# Patient Record
Sex: Female | Born: 1995 | Race: Black or African American | Hispanic: No | Marital: Single | Smoking: Never smoker
Health system: Southern US, Community
[De-identification: ages and names within clinical notes are randomized; demographics above are authoritative.]

## PROBLEM LIST (undated history)

## (undated) DIAGNOSIS — Z789 Other specified health status: Secondary | ICD-10-CM

## (undated) HISTORY — PX: NO PAST SURGERIES: SHX2092

---

## 2020-03-27 LAB — OB RESULTS CONSOLE GBS: GBS: POSITIVE

## 2020-06-15 NOTE — L&D Delivery Note (Signed)
OB/GYN Faculty Practice Delivery Note  Laura Roy is a 25 y.o. G3P1001 s/p SVD at [redacted]w[redacted]d. She was admitted for IOL for Post-dates.   ROM: 3h 65m with clear fluid GBS Status: Positive, adequately treated Maximum Maternal Temperature: 99.1  Labor Progress: . Pit 0200  . AROM @1800   Delivery Date/Time: 10/18/20 at 2111 Delivery: Called to room and patient was complete and pushing. Head delivered OA. No nuchal cord present. Shoulder and body delivered in usual fashion. Infant with spontaneous cry, placed on mother's abdomen, dried and stimulated. Cord clamped x 2 after 1-minute delay, and cut by fob under my direct supervision. Cord blood drawn. Placenta delivered spontaneously with gentle cord traction. Fundus firm with massage and Pitocin. Labia, perineum, vagina, and cervix were inspected, no tears observed.   Placenta: Intact, 3 vessel cord, sent to L&D Complications: Terminal Meconium Lacerations: None EBL: 25 mL Analgesia: Epidural  Infant: Healthy Baby Girl  APGARs 8,9   2112, MD Family Medicine PGY-1, Faculty Practice

## 2020-09-05 ENCOUNTER — Encounter: Payer: Self-pay | Admitting: Obstetrics and Gynecology

## 2020-09-05 ENCOUNTER — Ambulatory Visit (INDEPENDENT_AMBULATORY_CARE_PROVIDER_SITE_OTHER): Payer: BC Managed Care – PPO | Admitting: Obstetrics and Gynecology

## 2020-09-05 ENCOUNTER — Other Ambulatory Visit: Payer: Self-pay

## 2020-09-05 DIAGNOSIS — Z3A39 39 weeks gestation of pregnancy: Secondary | ICD-10-CM | POA: Diagnosis not present

## 2020-09-05 DIAGNOSIS — R8271 Bacteriuria: Secondary | ICD-10-CM | POA: Insufficient documentation

## 2020-09-05 DIAGNOSIS — Z3480 Encounter for supervision of other normal pregnancy, unspecified trimester: Secondary | ICD-10-CM | POA: Diagnosis not present

## 2020-09-05 DIAGNOSIS — Z3A34 34 weeks gestation of pregnancy: Secondary | ICD-10-CM

## 2020-09-05 DIAGNOSIS — Z3490 Encounter for supervision of normal pregnancy, unspecified, unspecified trimester: Secondary | ICD-10-CM | POA: Insufficient documentation

## 2020-09-05 DIAGNOSIS — O99013 Anemia complicating pregnancy, third trimester: Secondary | ICD-10-CM | POA: Diagnosis not present

## 2020-09-05 NOTE — Progress Notes (Signed)
   PRENATAL VISIT NOTE  Subjective:  Laura Roy is a 25 y.o. G3P1001 at [redacted]w[redacted]d being seen today for ongoing prenatal care.  She is currently monitored for the following issues for this low-risk pregnancy and has Supervision of normal pregnancy, antepartum on their problem list.  Pt is a late transfer of care from [name] , [name].  Records reviewed.  Still accumulating some lab info.  Per pt no issues this pregnancy with HTN or blood sugar control.  Patient doing well with no acute concerns today. She reports mild left hip pain.  Contractions: Irritability. Vag. Bleeding: None.  Movement: Present. Denies leaking of fluid.   The following portions of the patient's history were reviewed and updated as appropriate: allergies, current medications, past family history, past medical history, past social history, past surgical history and problem list. Problem list updated.  Objective:   Vitals:   09/05/20 0826  BP: 130/84  Pulse: (!) 112  Weight: 216 lb (98 kg)    Fetal Status: Fetal Heart Rate (bpm): 144 Fundal Height: 34 cm Movement: Present     General:  Alert, oriented and cooperative. Patient is in no acute distress.  Skin: Skin is warm and dry. No rash noted.   Cardiovascular: Normal heart rate noted  Respiratory: Normal respiratory effort, no problems with respiration noted  Abdomen: Soft, gravid, appropriate for gestational age.  Pain/Pressure: Present     Pelvic: Cervical exam deferred        Extremities: Normal range of motion.  Edema: None  Mental Status:  Normal mood and affect. Normal behavior. Normal judgment and thought content.   Assessment and Plan:  Pregnancy: G3P1001 at [redacted]w[redacted]d  1. Encounter for supervision of normal pregnancy, antepartum, unspecified gravidity G3P1, follow up in 2 week for GC/C only, check cervix.  Preterm labor symptoms and general obstetric precautions including but not limited to vaginal bleeding, contractions, leaking of fluid and fetal movement  were reviewed in detail with the patient.  Please refer to After Visit Summary for other counseling recommendations.   Return in about 2 weeks (around 09/19/2020) for ROB, in person, 36 weeks swabs.   Mariel Aloe, MD Faculty Attending [facility]

## 2020-09-05 NOTE — Progress Notes (Signed)
Per notes Transfer NOB [redacted]w[redacted]d [employer] not attached in [employer] seen in care everywhere.from 03/2020 LMP:12/19/19 Last pap:04/09/19 : LGSIL Urine Cx +GBS from last office . Per notes will treat in labor  *pt had U/S on 04/02/20 EDD 10/12/20.  CC: Hip pain causing discomfort when walking.

## 2020-09-05 NOTE — Patient Instructions (Signed)

## 2020-09-13 ENCOUNTER — Other Ambulatory Visit: Payer: Self-pay

## 2020-09-13 ENCOUNTER — Encounter (HOSPITAL_COMMUNITY): Payer: Self-pay | Admitting: Emergency Medicine

## 2020-09-13 ENCOUNTER — Emergency Department (HOSPITAL_COMMUNITY)
Admission: EM | Admit: 2020-09-13 | Discharge: 2020-09-13 | Disposition: A | Discharge status: Home / Self Care | Payer: BC Managed Care – PPO | Attending: Emergency Medicine | Admitting: Emergency Medicine

## 2020-09-13 DIAGNOSIS — Z3A36 36 weeks gestation of pregnancy: Secondary | ICD-10-CM | POA: Diagnosis not present

## 2020-09-13 DIAGNOSIS — M545 Low back pain, unspecified: Secondary | ICD-10-CM | POA: Insufficient documentation

## 2020-09-13 DIAGNOSIS — O26893 Other specified pregnancy related conditions, third trimester: Secondary | ICD-10-CM | POA: Diagnosis not present

## 2020-09-13 DIAGNOSIS — Z3490 Encounter for supervision of normal pregnancy, unspecified, unspecified trimester: Secondary | ICD-10-CM

## 2020-09-13 LAB — URINALYSIS, ROUTINE W REFLEX MICROSCOPIC
Bilirubin Urine: NEGATIVE
Glucose, UA: NEGATIVE mg/dL
Hgb urine dipstick: NEGATIVE
Ketones, ur: NEGATIVE mg/dL
Leukocytes,Ua: NEGATIVE
Nitrite: NEGATIVE
Protein, ur: NEGATIVE mg/dL
Specific Gravity, Urine: 1.001 — ABNORMAL LOW (ref 1.005–1.030)
pH: 8 (ref 5.0–8.0)

## 2020-09-13 MED ORDER — LACTATED RINGERS IV BOLUS
1000.0000 mL | Freq: Once | INTRAVENOUS | Status: AC
  Administered 2020-09-13: 1000 mL via INTRAVENOUS

## 2020-09-13 NOTE — ED Triage Notes (Signed)
Patient complains of 'period-like cramping' since this morning, but endorses back pain since Tuesday. Patient is 35 and 5, G2P1, denies bleeding or spotting.

## 2020-09-13 NOTE — Discharge Instructions (Addendum)
Go to [facility] hospital for pregnancy/labor.  You can safely take tylenol for low back pain.

## 2020-09-13 NOTE — ED Provider Notes (Addendum)
[facility]-EMERGENCY DEPT Provider Note   CSN: [id number] Arrival date & time: 09/13/20  1142     History Chief Complaint  Patient presents with  . Back Pain  . Abdominal Cramping    Laura Roy is a 25 y.o. female.  Pt complains of lower abdominal pain and cramping.  Pt is 35.[redacted] weeks pregnant. G2P1 1001.  Pt reports some low back pain.  Pt reports pain feels like menstrual cramps.  Pt denies feeling like she is in labor.  Pt denies any other complaints Pt has had one prenatal visit at [facility].   The history is provided by the patient. No language interpreter was used.  Abdominal Cramping       History reviewed. No pertinent past medical history.  Patient Active Problem List   Diagnosis Date Noted  . Supervision of normal pregnancy, antepartum 09/05/2020  . [redacted] weeks gestation of pregnancy 09/05/2020  . GBS bacteriuria 09/05/2020    History reviewed. No pertinent surgical history.   OB History    Gravida  3   Para  1   Term  1   Preterm      AB      Living  1     SAB      IAB      Ectopic      Multiple      Live Births  1           Family History  Problem Relation Age of Onset  . Kidney Stones Mother     Social History   Tobacco Use  . Smoking status: Never Smoker  . Smokeless tobacco: Never Used  Vaping Use  . Vaping Use: Never used  Substance Use Topics  . Alcohol use: Not Currently  . Drug use: Never    Home Medications Prior to Admission medications   Medication Sig Start Date End Date Taking? Authorizing Provider  acetaminophen (TYLENOL) 325 MG tablet Take 650 mg by mouth every 6 (six) hours as needed.    [provider]    Allergies    Patient has no known allergies.  Review of Systems   Review of Systems  All other systems reviewed and are negative.   Physical Exam Updated Vital Signs BP (!) 138/91 (BP Location: Left Arm)   Pulse 99   Temp 97.8 F (36.6 C) (Oral)   Resp (!) 22    Ht 5\' 5"  (1.651 m)   Wt 98 kg   LMP 12/19/2019   SpO2 99%   BMI 35.94 kg/m   Physical Exam Vitals and nursing note reviewed.  Constitutional:      Appearance: She is well-developed.  HENT:     Head: Normocephalic.  Cardiovascular:     Rate and Rhythm: Normal rate and regular rhythm.  Pulmonary:     Effort: Pulmonary effort is normal.  Abdominal:     General: Abdomen is flat. There is no distension.  Musculoskeletal:        General: Normal range of motion.     Cervical back: Normal range of motion.  Skin:    General: Skin is warm.  Neurological:     General: No focal deficit present.     Mental Status: She is alert and oriented to person, place, and time.  Psychiatric:        Mood and Affect: Mood normal.     ED Results / Procedures / Treatments   Labs (all labs ordered are listed, but only  abnormal results are displayed) Labs Reviewed - No data to display  EKG None  Radiology No results found.  Procedures Procedures   Medications Ordered in ED Medications - No data to display  ED Course  I have reviewed the triage vital signs and the nursing notes.  Pertinent labs & imaging results that were available during my care of the patient were reviewed by me and considered in my medical decision making (see chart for details).    MDM Rules/Calculators/A&P                          MDM:  Rapid response OB nurse at bedside.  Pt cervix is less tahn 1, no current labor.   Pt advised she cn take tylenol  [facility]gan[facility] issues  Final Clinical Impression(s) / ED Diagnoses Final diagnoses:  Encounter for supervision of normal pregnancy, antepartum, unspecified gravidity    Rx / DC Orders ED Discharge Orders    None       Osie Cheeks 09/13/20 1237    Elson Areas, PA-C 09/13/20 1354    Benjiman Core, MD 09/13/20 1430

## 2020-09-13 NOTE — Progress Notes (Signed)
G2P1 at 35 6/7 weeks reports to [facility] with c/o period cramping since 9am and constant back pain since Tuesday.  No bleeding or leaking noted.  SVE 1/thick/ballotable and unable to determine presentation.  In no distress.   Just started [facility] with [facility].  Moved to [place] in middle of January 2022.  Monitors on for EFM.

## 2020-09-13 NOTE — Progress Notes (Signed)
Talked with [facility] provider.  Cleared by OB Service.  Pt to follow up with [facility] on scheduled appt this Thursday.  Call office with any questions or concerns.  Instructed to come to MAU at [facility] with any OB complaints.

## 2020-09-18 ENCOUNTER — Ambulatory Visit (INDEPENDENT_AMBULATORY_CARE_PROVIDER_SITE_OTHER): Payer: BC Managed Care – PPO | Admitting: Women's Health

## 2020-09-18 ENCOUNTER — Other Ambulatory Visit: Payer: Self-pay

## 2020-09-18 ENCOUNTER — Other Ambulatory Visit (HOSPITAL_COMMUNITY)
Admission: RE | Admit: 2020-09-18 | Discharge: 2020-09-18 | Disposition: A | Discharge status: Home / Self Care | Payer: BC Managed Care – PPO | Source: Ambulatory Visit | Attending: Obstetrics and Gynecology | Admitting: Obstetrics and Gynecology

## 2020-09-18 VITALS — BP 133/81 | HR 114 | Wt 215.0 lb

## 2020-09-18 DIAGNOSIS — R8271 Bacteriuria: Secondary | ICD-10-CM

## 2020-09-18 DIAGNOSIS — Z3480 Encounter for supervision of other normal pregnancy, unspecified trimester: Secondary | ICD-10-CM | POA: Insufficient documentation

## 2020-09-18 DIAGNOSIS — Z3A36 36 weeks gestation of pregnancy: Secondary | ICD-10-CM | POA: Insufficient documentation

## 2020-09-18 DIAGNOSIS — O283 Abnormal ultrasonic finding on antenatal screening of mother: Secondary | ICD-10-CM | POA: Insufficient documentation

## 2020-09-18 DIAGNOSIS — Z3483 Encounter for supervision of other normal pregnancy, third trimester: Secondary | ICD-10-CM | POA: Insufficient documentation

## 2020-09-18 DIAGNOSIS — R87612 Low grade squamous intraepithelial lesion on cytologic smear of cervix (LGSIL): Secondary | ICD-10-CM | POA: Insufficient documentation

## 2020-09-18 LAB — OB RESULTS CONSOLE GC/CHLAMYDIA: Gonorrhea: NEGATIVE

## 2020-09-18 NOTE — Progress Notes (Signed)
Subjective:  Laura Roy is a 25 y.o. G3P1001 at [redacted]w[redacted]d being seen today for ongoing prenatal care.  She is currently monitored for the following issues for this low-risk pregnancy and has Supervision of normal pregnancy, antepartum; GBS bacteriuria; LGSIL on Pap smear of cervix; and Abnormal fetal ultrasound on their problem list.  Patient reports no complaints.  Contractions: Irregular. Vag. Bleeding: None.  Movement: Present. Denies leaking of fluid.   The following portions of the patient's history were reviewed and updated as appropriate: allergies, current medications, past family history, past medical history, past social history, past surgical history and problem list. Problem list updated.  Objective:   Vitals:   09/18/20 1100  BP: 133/81  Pulse: (!) 114  Weight: 215 lb (97.5 kg)    Fetal Status: Fetal Heart Rate (bpm): 150 Fundal Height: 36 cm Movement: Present     General:  Alert, oriented and cooperative. Patient is in no acute distress.  Skin: Skin is warm and dry. No rash noted.   Cardiovascular: Normal heart rate noted  Respiratory: Normal respiratory effort, no problems with respiration noted  Abdomen: Soft, gravid, appropriate for gestational age. Pain/Pressure: Present     Pelvic: Vag. Bleeding: None     Cervical exam performed Dilation: 1 Effacement (%): 0 Station: Ballotable  Extremities: Normal range of motion.  Edema: None  Mental Status: Normal mood and affect. Normal behavior. Normal judgment and thought content.   Urinalysis:      Assessment and Plan:  Pregnancy: G3P1001 at [redacted]w[redacted]d  1. Supervision of other normal pregnancy, antepartum - peds list given - Cervicovaginal ancillary only( [place]) - Hepatitis C Antibody - HIV antibody (with reflex) - RPR - CBC - above labs not available from prenatal records - borderline BP, baseline pre-e labs drawn today  2. GBS bacteriuria -treat in labor  3. [redacted] weeks gestation of pregnancy  5. Abnormal  fetal ultrasound - bilateral fetal pyelectasis, repeat US ordered - US MFM OB DETAIL +14 WK; Future   Term labor symptoms and general obstetric precautions including but not limited to vaginal bleeding, contractions, leaking of fluid and fetal movement were reviewed in detail with the patient. I discussed the assessment and treatment plan with the patient. The patient was provided an opportunity to ask questions and all were answered. The patient agreed with the plan and demonstrated an understanding of the instructions. The patient was advised to call back or seek an in-person office evaluation/go to MAU at [facility] for any urgent or concerning symptoms. Please refer to After Visit Summary for other counseling recommendations.  Return in about 1 week (around 09/25/2020) for in-person LOB/APP OK, pt needs [place] with MFM.   Emiel Kielty, Odie Sera, NP

## 2020-09-18 NOTE — Patient Instructions (Addendum)
Maternity Assessment Unit (MAU)  The Maternity Assessment Unit (MAU) is located at the [facility] and [place] at [facility]. The address is: [address], [place], [place], [place] [address]. Please see map below for additional directions.    The Maternity Assessment Unit is designed to help you during your pregnancy, and for up to 6 weeks after delivery, with any pregnancy- or postpartum-related emergencies, if you think you are in labor, or if your water has broken. For example, if you experience nausea and vomiting, vaginal bleeding, severe abdominal or pelvic pain, elevated blood pressure or other problems related to your pregnancy or postpartum time, please come to the Maternity Assessment Unit for assistance.        Signs and Symptoms of Labor Labor is the body's natural process of moving the baby and the placenta out of the uterus. The process of labor usually starts when the baby is full-term, between 23 and 40 weeks of pregnancy. Signs and symptoms that you are close to going into labor As your body prepares for labor and the birth of your baby, you may notice the following symptoms in the weeks and days before true labor starts:  Passing a small amount of thick, bloody mucus from your vagina. This is called normal bloody show or losing your mucus plug. This may happen more than a week before labor begins, or right before labor begins, as the opening of the cervix starts to widen (dilate). For some women, the entire mucus plug passes at once. For others, pieces of the mucus plug may gradually pass over several days.  Your baby moving (dropping) lower in your pelvis to get into position for birth (lightening). When this happens, you may feel more pressure on your bladder and pelvic bone and less pressure on your ribs. This may make it easier to breathe. It may also cause you to need to urinate more often and have problems with bowel movements.  Having "practice  contractions," also called Braxton Hicks contractions or false labor. These occur at irregular (unevenly spaced) intervals that are more than 10 minutes apart. False labor contractions are common after exercise or sexual activity. They will stop if you change position, rest, or drink fluids. These contractions are usually mild and do not get stronger over time. They may feel like: ? A backache or back pain. ? Mild cramps, similar to menstrual cramps. ? Tightening or pressure in your abdomen. Other early symptoms include:  Nausea or loss of appetite.  Diarrhea.  Having a sudden burst of energy, or feeling very tired.  Mood changes.  Having trouble sleeping.   Signs and symptoms that labor has begun Signs that you are in labor may include:  Having contractions that come at regular (evenly spaced) intervals and increase in intensity. This may feel like more intense tightening or pressure in your abdomen that moves to your back. ? Contractions may also feel like rhythmic pain in your upper thighs or back that comes and goes at regular intervals. ? For first-time mothers, this change in intensity of contractions often occurs at a more gradual pace. ? Women who have given birth before may notice a more rapid progression of contraction changes.  Feeling pressure in the vaginal area.  Your water breaking (rupture of membranes). This is when the sac of fluid that surrounds your baby breaks. Fluid leaking from your vagina may be clear or blood-tinged. Labor usually starts within 24 hours of your water breaking, but it  may take longer to begin. ? Some women may feel a sudden gush of fluid. ? Others notice that their underwear repeatedly becomes damp. Follow these instructions at home:  When labor starts, or if your water breaks, call your health care provider or nurse care line. Based on your situation, they will determine when you should go in for an exam.  During early labor, you may be able  to rest and manage symptoms at home. Some strategies to try at home include: ? Breathing and relaxation techniques. ? Taking a warm bath or shower. ? Listening to music. ? Using a heating pad on the lower back for pain. If you are directed to use heat:  Place a towel between your skin and the heat source.  Leave the heat on for 20-30 minutes.  Remove the heat if your skin turns bright red. This is especially important if you are unable to feel pain, heat, or cold. You may have a greater risk of getting burned.   Contact a health care provider if:  Your labor has started.  Your water breaks. Get help right away if:  You have painful, regular contractions that are 5 minutes apart or less.  Labor starts before you are [redacted] weeks along in your pregnancy.  You have a fever.  You have bright red blood coming from your vagina.  You do not feel your baby moving.  You have a severe headache with or without vision problems.  You have severe nausea, vomiting, or diarrhea.  You have chest pain or shortness of breath. These symptoms may represent a serious problem that is an emergency. Do not wait to see if the symptoms will go away. Get medical help right away. Call your local emergency services (911 in the [place] Do not drive yourself to the hospital. Summary  Labor is your body's natural process of moving your baby and the placenta out of your uterus.  The process of labor usually starts when your baby is full-term, between 72 and 40 weeks of pregnancy.  When labor starts, or if your water breaks, call your health care provider or nurse care line. Based on your situation, they will determine when you should go in for an exam. This information is not intended to replace advice given to you by your health care provider. Make sure you discuss any questions you have with your health care provider. Document Revised: 03/23/2020 Document Reviewed: 03/23/2020 Elsevier Patient Education  2021  Elsevier Inc.       AREA PEDIATRIC/FAMILY PRACTICE PHYSICIANS  [facility] [address] [place], [place] [address] Phone - [phone]   Fax - [phone]  [facility] [address] [place], [place]  [address] Phone - [phone]   Fax - [phone]  [facility] [address] [place], [place]  [address] Phone - [phone]   Fax - [phone]  [facility] [address] [place], [place]  [address] Phone - [phone]   Fax - [phone]  [facility] [address], [address] [place], [place]  [address] Phone - [phone]   Fax - [phone]  [facility] [address] [place], [place]  [address] Phone - [phone]   Fax - [phone]  [facility] [place] [address] [place], [place]  [address] Phone - [phone]   Fax - [phone]  [facility] AT [facility] [address] [place], [address] [place],  [place]  [address] Phone - [phone]   Fax - [phone]  [facility] AT [facility] [address] [place], [place]  [address] Phone - [phone]   Fax - [phone] [facility] AT [place] [address] [place], [place]  [address] Phone - [phone]   Fax - [phone]  [facility] [address] [place], [place]  [address] Phone - [phone]   Fax - [phone]  [facility] [address], [address] [place], [place]  [address] Phone - [phone]   Fax - [phone]  [facility] [place] [address] [place], [place]  [address] Phone - [phone]   Fax - [phone]  [facility] [address] [place], [place]  [address] Phone - [phone]  [facility] [address] [place], [place]  [address] Phone - [phone]   Fax - [phone]  [facility] [address] [place], [place]  [address] Phone - [phone]   Fax - [phone]  [facility] [address] [place], [place]  [address] Phone - [phone]   Fax - [phone]  [facility] [address] [place], [place]  [address] Phone - [phone]   Fax - [phone]   [facility] [address] [place], [place]  [address] Phone - [phone]   Fax - [phone]  [facility] [address] [place], [place]  [address] Phone - [phone]   Fax - [phone]  DAVID RUBIN [address] [place], [place]  [address] Phone - [phone]   Fax - [phone]  [facility] [address] [place], [place]  [address] Phone - [phone]   Fax - [phone]  [place] [facility] Alita Chyle [address] [place], [place]  [address] Phone - [phone]   Fax - [phone] Gerarda Fraction [address] [place], [place]  [address] Phone - [phone]   Fax - [phone]  [facility] [address] [place], [place]  [address] Phone - [phone]   Fax - [phone]  [facility] [place] [address] [place], [place]  [address] Phone - [phone]   Fax - [phone]

## 2020-09-18 NOTE — Progress Notes (Signed)
+   Fetal movement. Pt is having irregular contractions about every 10-15 minutes about since last night.

## 2020-09-19 ENCOUNTER — Encounter: Payer: BC Managed Care – PPO | Admitting: Obstetrics and Gynecology

## 2020-09-19 ENCOUNTER — Ambulatory Visit: Payer: BC Managed Care – PPO | Admitting: *Deleted

## 2020-09-19 ENCOUNTER — Ambulatory Visit: Payer: BC Managed Care – PPO | Attending: Women's Health

## 2020-09-19 ENCOUNTER — Encounter: Payer: Self-pay | Admitting: *Deleted

## 2020-09-19 DIAGNOSIS — Z3480 Encounter for supervision of other normal pregnancy, unspecified trimester: Secondary | ICD-10-CM

## 2020-09-19 DIAGNOSIS — O99213 Obesity complicating pregnancy, third trimester: Secondary | ICD-10-CM | POA: Diagnosis not present

## 2020-09-19 DIAGNOSIS — O283 Abnormal ultrasonic finding on antenatal screening of mother: Secondary | ICD-10-CM | POA: Diagnosis not present

## 2020-09-19 DIAGNOSIS — Z3A36 36 weeks gestation of pregnancy: Secondary | ICD-10-CM | POA: Diagnosis not present

## 2020-09-19 DIAGNOSIS — E669 Obesity, unspecified: Secondary | ICD-10-CM | POA: Diagnosis not present

## 2020-09-19 DIAGNOSIS — O359XX0 Maternal care for (suspected) fetal abnormality and damage, unspecified, not applicable or unspecified: Secondary | ICD-10-CM

## 2020-09-19 LAB — CBC
Hematocrit: 32.1 % — ABNORMAL LOW (ref 34.0–46.6)
Hemoglobin: 9.9 g/dL — ABNORMAL LOW (ref 11.1–15.9)
MCH: 25.1 pg — ABNORMAL LOW (ref 26.6–33.0)
MCHC: 30.8 g/dL — ABNORMAL LOW (ref 31.5–35.7)
MCV: 82 fL (ref 79–97)
Platelets: 321 10*3/uL (ref 150–450)
RBC: 3.94 x10E6/uL (ref 3.77–5.28)
RDW: 14.3 % (ref 11.7–15.4)
WBC: 6.9 10*3/uL (ref 3.4–10.8)

## 2020-09-19 LAB — COMPREHENSIVE METABOLIC PANEL
ALT: 14 IU/L (ref 0–32)
AST: 13 IU/L (ref 0–40)
Albumin/Globulin Ratio: 1.2 (ref 1.2–2.2)
Albumin: 3.6 g/dL — ABNORMAL LOW (ref 3.9–5.0)
Alkaline Phosphatase: 100 IU/L (ref 44–121)
BUN/Creatinine Ratio: 6 — ABNORMAL LOW (ref 9–23)
BUN: 3 mg/dL — ABNORMAL LOW (ref 6–20)
Bilirubin Total: <0.2 mg/dL (ref 0.0–1.2)
CO2: 18 mmol/L — ABNORMAL LOW (ref 20–29)
Calcium: 9 mg/dL (ref 8.7–10.2)
Chloride: 103 mmol/L (ref 96–106)
Creatinine, Ser: 0.53 mg/dL — ABNORMAL LOW (ref 0.57–1.00)
Globulin, Total: 3 g/dL (ref 1.5–4.5)
Glucose: 87 mg/dL (ref 65–99)
Potassium: 4.2 mmol/L (ref 3.5–5.2)
Sodium: 138 mmol/L (ref 134–144)
Total Protein: 6.6 g/dL (ref 6.0–8.5)
eGFR: 130 mL/min/{1.73_m2} (ref >59)

## 2020-09-19 LAB — CERVICOVAGINAL ANCILLARY ONLY
Chlamydia: NEGATIVE
Comment: NEGATIVE
Comment: NEGATIVE
Comment: NORMAL
Neisseria Gonorrhea: NEGATIVE
Trichomonas: NEGATIVE

## 2020-09-19 LAB — HIV ANTIBODY (ROUTINE TESTING W REFLEX): HIV Screen 4th Generation wRfx: NONREACTIVE

## 2020-09-19 LAB — RPR: RPR Ser Ql: NONREACTIVE

## 2020-09-19 LAB — PROTEIN / CREATININE RATIO, URINE
Creatinine, Urine: 63.9 mg/dL
Protein, Ur: 9.9 mg/dL
Protein/Creat Ratio: 155 mg/g creat (ref 0–200)

## 2020-09-19 LAB — HEPATITIS C ANTIBODY: Hep C Virus Ab: <0.1 s/co ratio (ref 0.0–0.9)

## 2020-09-21 ENCOUNTER — Encounter: Payer: Self-pay | Admitting: Women's Health

## 2020-09-21 DIAGNOSIS — O99019 Anemia complicating pregnancy, unspecified trimester: Secondary | ICD-10-CM | POA: Insufficient documentation

## 2020-09-25 ENCOUNTER — Other Ambulatory Visit: Payer: Self-pay

## 2020-09-25 ENCOUNTER — Ambulatory Visit (INDEPENDENT_AMBULATORY_CARE_PROVIDER_SITE_OTHER): Payer: BC Managed Care – PPO | Admitting: Women's Health

## 2020-09-25 ENCOUNTER — Encounter: Payer: Self-pay | Admitting: Women's Health

## 2020-09-25 VITALS — BP 128/77 | HR 103 | Wt 216.0 lb

## 2020-09-25 DIAGNOSIS — R8271 Bacteriuria: Secondary | ICD-10-CM

## 2020-09-25 DIAGNOSIS — R87612 Low grade squamous intraepithelial lesion on cytologic smear of cervix (LGSIL): Secondary | ICD-10-CM

## 2020-09-25 DIAGNOSIS — Z3480 Encounter for supervision of other normal pregnancy, unspecified trimester: Secondary | ICD-10-CM

## 2020-09-25 DIAGNOSIS — Z3A37 37 weeks gestation of pregnancy: Secondary | ICD-10-CM

## 2020-09-25 DIAGNOSIS — O283 Abnormal ultrasonic finding on antenatal screening of mother: Secondary | ICD-10-CM

## 2020-09-25 DIAGNOSIS — O99013 Anemia complicating pregnancy, third trimester: Secondary | ICD-10-CM

## 2020-09-25 MED ORDER — FERROUS SULFATE 324 (65 FE) MG PO TBEC: 1.0000 | DELAYED_RELEASE_TABLET | ORAL | 1 refills | Status: DC

## 2020-09-25 NOTE — Progress Notes (Signed)
Subjective:  Laura Roy is a 25 y.o. G3P1001 at [redacted]w[redacted]d being seen today for ongoing prenatal care.  She is currently monitored for the following issues for this low-risk pregnancy and has Supervision of normal pregnancy, antepartum; GBS bacteriuria; LGSIL on Pap smear of cervix; Abnormal fetal ultrasound; and Anemia in pregnancy on their problem list.  Patient reports no complaints.  Contractions: Irritability. Vag. Bleeding: None.  Movement: Present. Denies leaking of fluid.   The following portions of the patient's history were reviewed and updated as appropriate: allergies, current medications, past family history, past medical history, past social history, past surgical history and problem list. Problem list updated.  Objective:   Vitals:   09/25/20 0856  BP: 128/77  Pulse: (!) 103  Weight: 216 lb (98 kg)    Fetal Status: Fetal Heart Rate (bpm): 138   Movement: Present  Presentation: Undeterminable  General:  Alert, oriented and cooperative. Patient is in no acute distress.  Skin: Skin is warm and dry. No rash noted.   Cardiovascular: Normal heart rate noted  Respiratory: Normal respiratory effort, no problems with respiration noted  Abdomen: Soft, gravid, appropriate for gestational age. Pain/Pressure: Present     Pelvic: Vag. Bleeding: None Vag D/C Character: Thin   Cervical exam performed Dilation: 1 Effacement (%): 0 Station: Ballotable  Extremities: Normal range of motion.  Edema: None  Mental Status: Normal mood and affect. Normal behavior. Normal judgment and thought content.   Urinalysis:      Assessment and Plan:  Pregnancy: G3P1001 at [redacted]w[redacted]d  1. Supervision of other normal pregnancy, antepartum -no concerns  2. GBS bacteriuria -treat in labor  3. Abnormal fetal ultrasound -fetal pyelectasis bilateral on anatomy scan, f/u ordered -->L kidney nl, R kidney mild 0.8cm, no f/u indicated per MFM until delivery  4. Anemia during pregnancy in third trimester -RX  oral iron  5. [redacted] weeks gestation of pregnancy   Term labor symptoms and general obstetric precautions including but not limited to vaginal bleeding, contractions, leaking of fluid and fetal movement were reviewed in detail with the patient. I discussed the assessment and treatment plan with the patient. The patient was provided an opportunity to ask questions and all were answered. The patient agreed with the plan and demonstrated an understanding of the instructions. The patient was advised to call back or seek an in-person office evaluation/go to MAU at [facility] for any urgent or concerning symptoms. Please refer to After Visit Summary for other counseling recommendations.  Return in about 1 week (around 10/02/2020) for in-person LOB/APP OK.   Keno Caraway, Odie Sera, NP

## 2020-09-25 NOTE — Progress Notes (Signed)
ROB [redacted]w[redacted]d  GBS in urine GC/CT done on 09/18/20 : Negative   CC: pt would like cervix check notes pressure. HA's no swelling and cramping.

## 2020-09-25 NOTE — Patient Instructions (Signed)
Maternity Assessment Unit (MAU)  The Maternity Assessment Unit (MAU) is located at the [facility] at [facility]. The address is: [address] [place], [place], [place] [address]. Please see map below for additional directions.    The Maternity Assessment Unit is designed to help you during your pregnancy, and for up to 6 weeks after delivery, with any pregnancy- or postpartum-related emergencies, if you think you are in labor, or if your water has broken. For example, if you experience nausea and vomiting, vaginal bleeding, severe abdominal or pelvic pain, elevated blood pressure or other problems related to your pregnancy or postpartum time, please come to the Maternity Assessment Unit for assistance.        Pregnancy and Anemia  Anemia is a condition in which there is not enough red blood cells or hemoglobin in the blood. Hemoglobin is a substance in red blood cells that carries oxygen. When you do not have enough red blood cells or hemoglobin (are anemic), your body cannot get enough oxygen and your organs may not work properly. Anemia is common during pregnancy because your body needs more blood volume and blood cells to provide nutrition to the unborn baby. What are the causes? The most common cause of anemia during pregnancy is not having enough iron in the body to make red blood cells (iron deficiency anemia). Other causes may include:  Folic acid deficiency.  Vitamin B12 deficiency.  Certain prescription or over-the-counter medicines.  Certain medical conditions or infections that destroy red blood cells.  A low platelet count and bleeding caused by antibodies that go through the placenta to the baby from the mother's blood. What are the signs or symptoms? Mild anemia may not cause any symptoms. If anemia becomes severe, symptoms may include:  Feeling tired or weak.  Shortness of breath, especially during activity.  Fainting.  Pale  skin.  Headaches.  A fast or irregular heartbeat.  Dizziness. How is this diagnosed? This condition may be diagnosed based on your medical history and a physical exam. You may also have blood tests. How is this treated? Treatment for anemia during pregnancy depends on the cause of the anemia. Treatment may include:  Making changes to your diet.  Taking iron, vitamin B12, or folic acid supplements.  Having a blood transfusion. This may be needed if the anemia is severe. Follow these instructions at home: Eating and drinking  Follow recommendations from your health care provider about changing your diet.  Eat a diet rich in iron. This would include foods such as: ? Liver. ? Beef. ? Eggs. ? Whole grains. ? Spinach. ? Dried fruit.  Increase your vitamin C intake. This will help the stomach absorb more iron. Some foods that are high in vitamin C include: ? Oranges. ? Peppers. ? Tomatoes. ? Mangoes.  Eat green leafy vegetables. These are a good source of folic acid. General instructions  Take iron supplements and vitamins as told by your health care provider.  Keep all follow-up visits. This is important. Contact a health care provider if:  You have headaches that happen often or do not go away.  You bruise easily.  You have a fever.  You have nausea and vomiting for more than 24 hours.  You are unable to take supplements prescribed to treat your anemia. Get help right away if:  You develop signs or symptoms of severe anemia.  You have bleeding from your vagina.  You develop a rash.  You have  bloody or tarry stools.  You are very dizzy or you faint. Summary  Anemia is a condition in which there is not enough red blood cells or hemoglobin in the blood.  The most common cause of anemia during pregnancy is not having enough iron in the body to make red blood cells (iron deficiency anemia).  Mild anemia may not cause any symptoms. If it becomes severe,  symptoms may include feeling tired and weak.  Take iron supplements and vitamins as told by your health care provider.  Keep all follow-up visits. This is important. This information is not intended to replace advice given to you by your health care provider. Make sure you discuss any questions you have with your health care provider. Document Revised: 10/03/2019 Document Reviewed: 10/03/2019 Elsevier Patient Education  2021 [employer].       Your bloodwork shows that you have anemia. I have sent you a prescription for ferrous sulfate 325mg . Take 1 tablet, every other day. Take iron without food, if possible. If it must be taken with food due to upset stomach, take with orange juice. Avoid taking with milk, cereal, tea, coffee and eggs. Common side effects of iron include: nausea, vomiting gas, constipation, diarrhea and black or green stool. If side effects prevent taking, you may take with orange juice to decrease stomach upset and increase absorption. Please contact our office if you have any questions or concerns. If you have side effects that are intolerable, please contact our office so your medication can be adjusted.  Thank you,        Signs and Symptoms of Labor Labor is the body's natural process of moving the baby and the placenta out of the uterus. The process of labor usually starts when the baby is full-term, between 27 and 40 weeks of pregnancy. Signs and symptoms that you are close to going into labor As your body prepares for labor and the birth of your baby, you may notice the following symptoms in the weeks and days before true labor starts:  Passing a small amount of thick, bloody mucus from your vagina. This is called normal bloody show or losing your mucus plug. This may happen more than a week before labor begins, or right before labor begins, as the opening of the cervix starts to widen (dilate). For some women, the entire mucus plug passes at once. For  others, pieces of the mucus plug may gradually pass over several days.  Your baby moving (dropping) lower in your pelvis to get into position for birth (lightening). When this happens, you may feel more pressure on your bladder and pelvic bone and less pressure on your ribs. This may make it easier to breathe. It may also cause you to need to urinate more often and have problems with bowel movements.  Having "practice contractions," also called Braxton Hicks contractions or false labor. These occur at irregular (unevenly spaced) intervals that are more than 10 minutes apart. False labor contractions are common after exercise or sexual activity. They will stop if you change position, rest, or drink fluids. These contractions are usually mild and do not get stronger over time. They may feel like: ? A backache or back pain. ? Mild cramps, similar to menstrual cramps. ? Tightening or pressure in your abdomen. Other early symptoms include:  Nausea or loss of appetite.  Diarrhea.  Having a sudden burst of energy, or feeling very tired.  Mood changes.  Having trouble sleeping.   Signs and  symptoms that labor has begun Signs that you are in labor may include:  Having contractions that come at regular (evenly spaced) intervals and increase in intensity. This may feel like more intense tightening or pressure in your abdomen that moves to your back. ? Contractions may also feel like rhythmic pain in your upper thighs or back that comes and goes at regular intervals. ? For first-time mothers, this change in intensity of contractions often occurs at a more gradual pace. ? Women who have given birth before may notice a more rapid progression of contraction changes.  Feeling pressure in the vaginal area.  Your water breaking (rupture of membranes). This is when the sac of fluid that surrounds your baby breaks. Fluid leaking from your vagina may be clear or blood-tinged. Labor usually starts within 24  hours of your water breaking, but it may take longer to begin. ? Some women may feel a sudden gush of fluid. ? Others notice that their underwear repeatedly becomes damp. Follow these instructions at home:  When labor starts, or if your water breaks, call your health care provider or nurse care line. Based on your situation, they will determine when you should go in for an exam.  During early labor, you may be able to rest and manage symptoms at home. Some strategies to try at home include: ? Breathing and relaxation techniques. ? Taking a warm bath or shower. ? Listening to music. ? Using a heating pad on the lower back for pain. If you are directed to use heat:  Place a towel between your skin and the heat source.  Leave the heat on for 20-30 minutes.  Remove the heat if your skin turns bright red. This is especially important if you are unable to feel pain, heat, or cold. You may have a greater risk of getting burned.   Contact a health care provider if:  Your labor has started.  Your water breaks. Get help right away if:  You have painful, regular contractions that are 5 minutes apart or less.  Labor starts before you are [redacted] weeks along in your pregnancy.  You have a fever.  You have bright red blood coming from your vagina.  You do not feel your baby moving.  You have a severe headache with or without vision problems.  You have severe nausea, vomiting, or diarrhea.  You have chest pain or shortness of breath. These symptoms may represent a serious problem that is an emergency. Do not wait to see if the symptoms will go away. Get medical help right away. Call your local emergency services (911 in the [place] Do not drive yourself to the hospital. Summary  Labor is your body's natural process of moving your baby and the placenta out of your uterus.  The process of labor usually starts when your baby is full-term, between 37 and 40 weeks of pregnancy.  When labor  starts, or if your water breaks, call your health care provider or nurse care line. Based on your situation, they will determine when you should go in for an exam. This information is not intended to replace advice given to you by your health care provider. Make sure you discuss any questions you have with your health care provider. Document Revised: 03/23/2020 Document Reviewed: 03/23/2020 Elsevier Patient Education  2021 [employer].

## 2020-10-01 ENCOUNTER — Other Ambulatory Visit: Payer: Self-pay

## 2020-10-01 ENCOUNTER — Encounter (HOSPITAL_COMMUNITY): Payer: Self-pay | Admitting: Obstetrics and Gynecology

## 2020-10-01 ENCOUNTER — Inpatient Hospital Stay (HOSPITAL_COMMUNITY)
Admission: AD | Admit: 2020-10-01 | Discharge: 2020-10-01 | Disposition: A | Discharge status: Home / Self Care | Payer: BC Managed Care – PPO | Attending: Obstetrics and Gynecology | Admitting: Obstetrics and Gynecology

## 2020-10-01 DIAGNOSIS — Z3A38 38 weeks gestation of pregnancy: Secondary | ICD-10-CM | POA: Diagnosis not present

## 2020-10-01 DIAGNOSIS — O471 False labor at or after 37 completed weeks of gestation: Secondary | ICD-10-CM | POA: Diagnosis not present

## 2020-10-01 DIAGNOSIS — O479 False labor, unspecified: Secondary | ICD-10-CM

## 2020-10-01 HISTORY — DX: Other specified health status: Z78.9

## 2020-10-01 NOTE — MAU Note (Signed)
Pt reports contractions since last pm. Worsening. Denies bleeding or ROM. Reports decreased fetal movement.

## 2020-10-01 NOTE — MAU Provider Note (Signed)
S: Ms. Laura Roy is a 25 y.o. G3P1001 at [redacted]w[redacted]d  who presents to MAU today for labor evaluation.     Cervical exam by RN:  Dilation: 1.5 Effacement (%): 50 Cervical Position: Posterior Station: -2 Presentation: Vertex Exam by:: weston,rn  Fetal Monitoring: Baseline: 130 Variability: moderate Accelerations: multiple, reactive strip Decelerations: none Contractions: every 2-5 min  MDM Discussed patient with RN. NST reviewed.   A: [facility] at [redacted]w[redacted]d  False labor  P: Discharge home Labor precautions and kick counts included in AVS Patient to follow-up with Gerrit Heck, CNM on 10/02/20 as scheduled  Patient may return to MAU as needed or when in labor   Ranya Fiddler, Skipper Cliche, MD OB Fellow, [facility] 10/01/2020 7:19 AM

## 2020-10-01 NOTE — Discharge Instructions (Signed)
Rosen's Emergency Medicine: Concepts and Clinical Practice (9th ed., pp. 2296- 2312). Elsevier.">  Braxton Hicks Contractions Contractions of the uterus can occur throughout pregnancy, but they are not always a sign that you are in labor. You may have practice contractions called Braxton Hicks contractions. These false labor contractions are sometimes confused with true labor. What are Braxton Hicks contractions? Braxton Hicks contractions are tightening movements that occur in the muscles of the uterus before labor. Unlike true labor contractions, these contractions do not result in opening (dilation) and thinning of the cervix. Toward the end of pregnancy (32-34 weeks), Braxton Hicks contractions can happen more often and may become stronger. These contractions are sometimes difficult to tell apart from true labor because they can be very uncomfortable. You should not feel embarrassed if you go to the hospital with false labor. Sometimes, the only way to tell if you are in true labor is for your health care provider to look for changes in the cervix. The health care provider will do a physical exam and may monitor your contractions. If you are not in true labor, the exam should show that your cervix is not dilating and your water has not broken. If there are no other health problems associated with your pregnancy, it is completely safe for you to be sent home with false labor. You may continue to have Braxton Hicks contractions until you go into true labor. How to tell the difference between true labor and false labor True labor  Contractions last 30-70 seconds.  Contractions become very regular.  Discomfort is usually felt in the top of the uterus, and it spreads to the lower abdomen and low back.  Contractions do not go away with walking.  Contractions usually become more intense and increase in frequency.  The cervix dilates and gets thinner. False labor  Contractions are usually shorter  and not as strong as true labor contractions.  Contractions are usually irregular.  Contractions are often felt in the front of the lower abdomen and in the groin.  Contractions may go away when you walk around or change positions while lying down.  Contractions get weaker and are shorter-lasting as time goes on.  The cervix usually does not dilate or become thin. Follow these instructions at home:  Take over-the-counter and prescription medicines only as told by your health care provider.  Keep up with your usual exercises and follow other instructions from your health care provider.  Eat and drink lightly if you think you are going into labor.  If Braxton Hicks contractions are making you uncomfortable: ? Change your position from lying down or resting to walking, or change from walking to resting. ? Sit and rest in a tub of warm water. ? Drink enough fluid to keep your urine pale yellow. Dehydration may cause these contractions. ? Do slow and deep breathing several times an hour.  Keep all follow-up prenatal visits as told by your health care provider. This is important.   Contact a health care provider if:  You have a fever.  You have continuous pain in your abdomen. Get help right away if:  Your contractions become stronger, more regular, and closer together.  You have fluid leaking or gushing from your vagina.  You pass blood-tinged mucus (bloody show).  You have bleeding from your vagina.  You have low back pain that you never had before.  You feel your baby's head pushing down and causing pelvic pressure.  Your baby is not moving inside   you as much as it used to. Summary  Contractions that occur before labor are called Braxton Hicks contractions, false labor, or practice contractions.  Braxton Hicks contractions are usually shorter, weaker, farther apart, and less regular than true labor contractions. True labor contractions usually become progressively  stronger and regular, and they become more frequent.  Manage discomfort from Braxton Hicks contractions by changing position, resting in a warm bath, drinking plenty of water, or practicing deep breathing. This information is not intended to replace advice given to you by your health care provider. Make sure you discuss any questions you have with your health care provider. Document Revised: 05/14/2017 Document Reviewed: 10/15/2016 Elsevier Patient Education  2021 Elsevier Inc.   Fetal Movement Counts Patient Name: ________________________________________________ Patient Due Date: ____________________  What is a fetal movement count? A fetal movement count is the number of times that you feel your baby move during a certain amount of time. This may also be called a fetal kick count. A fetal movement count is recommended for every pregnant woman. You may be asked to start counting fetal movements as early as week 28 of your pregnancy. Pay attention to when your baby is most active. You may notice your baby's sleep and wake cycles. You may also notice things that make your baby move more. You should do a fetal movement count:  When your baby is normally most active.  At the same time each day. A good time to count movements is while you are resting, after having something to eat and drink. How do I count fetal movements? 1. Find a quiet, comfortable area. Sit, or lie down on your side. 2. Write down the date, the start time and stop time, and the number of movements that you felt between those two times. Take this information with you to your health care visits. 3. Write down your start time when you feel the first movement. 4. Count kicks, flutters, swishes, rolls, and jabs. You should feel at least 10 movements. 5. You may stop counting after you have felt 10 movements, or if you have been counting for 2 hours. Write down the stop time. 6. If you do not feel 10 movements in 2 hours, contact  your health care provider for further instructions. Your health care provider may want to do additional tests to assess your baby's well-being. Contact a health care provider if:  You feel fewer than 10 movements in 2 hours.  Your baby is not moving like he or she usually does. Date: ____________ Start time: ____________ Stop time: ____________ Movements: ____________ Date: ____________ Start time: ____________ Stop time: ____________ Movements: ____________ Date: ____________ Start time: ____________ Stop time: ____________ Movements: ____________ Date: ____________ Start time: ____________ Stop time: ____________ Movements: ____________ Date: ____________ Start time: ____________ Stop time: ____________ Movements: ____________ Date: ____________ Start time: ____________ Stop time: ____________ Movements: ____________ Date: ____________ Start time: ____________ Stop time: ____________ Movements: ____________ Date: ____________ Start time: ____________ Stop time: ____________ Movements: ____________ Date: ____________ Start time: ____________ Stop time: ____________ Movements: ____________ This information is not intended to replace advice given to you by your health care provider. Make sure you discuss any questions you have with your health care provider. Document Revised: 01/19/2019 Document Reviewed: 01/19/2019 Elsevier Patient Education  2021 Elsevier Inc.  

## 2020-10-01 NOTE — MAU Note (Signed)
I have communicated with Lynnda Shields, MD and reviewed vital signs:  Vitals:   10/01/20 0627 10/01/20 0716  BP: 122/74 124/79  Pulse: (!) 105 97  Resp:    Temp:    SpO2:  100%    Vaginal exam:  Dilation: 1.5 Effacement (%): 50 Cervical Position: Posterior Station: -2 Presentation: Vertex Exam by:: weston,rn,   Also reviewed contraction pattern and that non-stress test is reactive.  It has been documented that patient is contracting every 1.5-4.5 minutes with no cervical change over two hours not indicating active labor.  Patient denies any other complaints.  Based on this report provider has given order for discharge.  A discharge order and diagnosis entered by a provider.   Labor discharge instructions reviewed with patient.

## 2020-10-02 ENCOUNTER — Ambulatory Visit (INDEPENDENT_AMBULATORY_CARE_PROVIDER_SITE_OTHER): Payer: BC Managed Care – PPO

## 2020-10-02 VITALS — BP 118/80 | HR 101 | Wt 217.0 lb

## 2020-10-02 DIAGNOSIS — R110 Nausea: Secondary | ICD-10-CM

## 2020-10-02 DIAGNOSIS — Z3A38 38 weeks gestation of pregnancy: Secondary | ICD-10-CM

## 2020-10-02 DIAGNOSIS — Z3480 Encounter for supervision of other normal pregnancy, unspecified trimester: Secondary | ICD-10-CM

## 2020-10-02 NOTE — Patient Instructions (Signed)
Labor Induction Labor induction is when steps are taken to cause a pregnant woman to begin the labor process. Most women go into labor on their own between 37 weeks and 42 weeks of pregnancy. When this does not happen, or when there is a medical need for labor to begin, steps may be taken to induce, or bring on, labor. Labor induction causes a pregnant woman's uterus to contract. It also causes the cervix to soften (ripen), open (dilate), and thin out. Usually, labor is not induced before 39 weeks of pregnancy unless there is a medical reason to do so. When is labor induction considered? Labor induction may be right for you if:  Your pregnancy lasts longer than 41 to 42 weeks.  Your placenta is separating from your uterus (placental abruption).  You have a rupture of membranes and your labor does not begin.  You have health problems, like diabetes or high blood pressure (preeclampsia) during your pregnancy.  Your baby has stopped growing or does not have enough amniotic fluid. Before labor induction begins, your health care provider will consider the following factors:  Your medical condition and the baby's condition.  How many weeks you have been pregnant.  How mature the baby's lungs are.  The condition of your cervix.  The position of the baby.  The size of your birth canal. Tell a health care provider about:  Any allergies you have.  All medicines you are taking, including vitamins, herbs, eye drops, creams, and over-the-counter medicines.  Any problems you or your family members have had with anesthetic medicines.  Any surgeries you have had.  Any blood disorders you have.  Any medical conditions you have. What are the risks? Generally, this is a safe procedure. However, problems may occur, including:  Failed induction.  Changes in fetal heart rate, such as being too high, too low, or irregular (erratic).  Infection in the mother or the baby.  Increased risk of  having a cesarean delivery.  Breaking off (abruption) of the placenta from the uterus. This is rare.  Rupture of the uterus. This is very rare.  Your baby could fail to get enough blood flow or oxygen. This can be life-threatening. When induction is needed for medical reasons, the benefits generally outweigh the risks. What happens during the procedure? During the procedure, your health care provider will use one of these methods to induce labor:  Stripping the membranes. In this method, the amniotic sac tissue is gently separated from the cervix. This causes the following to happen: ? Your cervix stretches, which in turn causes the release of prostaglandins. ? Prostaglandins induce labor and cause the uterus to contract. ? This procedure is often done in an office visit. You will be sent home to wait for contractions to begin.  Prostaglandin medicine. This medicine starts contractions and causes the cervix to dilate and ripen. This can be taken by mouth (orally) or by being inserted into the vagina (suppository).  Inserting a small, thin tube (catheter) with a balloon into the vagina and then expanding the balloon with water to dilate the cervix.  Breaking the water. In this method, a small instrument is used to make a small hole in the amniotic sac. This eventually causes the amniotic sac to break. Contractions should begin within a few hours.  Medicine to trigger or strengthen contractions. This medicine is given through an IV that is inserted into a vein in your arm. This procedure may vary among health care providers and hospitals.     Where to find more information  [employer] of [employer]: www.marchofdimes.org  The American College of Obstetricians and Gynecologists: www.acog.org Summary  Labor induction causes a pregnant woman's uterus to contract. It also causes the cervix to soften (ripen), open (dilate), and thin out.  Labor is usually not induced before 39 weeks of pregnancy unless  there is a medical reason to do so.  When induction is needed for medical reasons, the benefits generally outweigh the risks.  Talk with your health care provider about which methods of labor induction are right for you. This information is not intended to replace advice given to you by your health care provider. Make sure you discuss any questions you have with your health care provider. Document Revised: 03/14/2020 Document Reviewed: 03/14/2020 Elsevier Patient Education  2021 [employer]vier Inc.  

## 2020-10-02 NOTE — Progress Notes (Signed)
ROB [redacted]w[redacted]d  Pt had Recent MAU visit yesterday for [facility] ctx's. Declines repeat cervix check  Needs to discuss changing leave date.

## 2020-10-02 NOTE — Progress Notes (Signed)
   LOW-RISK PREGNANCY OFFICE VISIT  Patient name: Laura Roy MRN [id number]  Date of birth: [birth year] Chief Complaint:   Routine Prenatal Visit  Subjective:   Laura Roy is a 25 y.o. G39P1001 female at [redacted]w[redacted]d with an Estimated Date of Delivery: 10/12/20 being seen today for ongoing management of a low-risk pregnancy aeb has Supervision of normal pregnancy, antepartum; GBS bacteriuria; LGSIL on Pap smear of cervix; Abnormal fetal ultrasound; and Anemia in pregnancy on their problem list.  Patient presents today with no complaints.  Patient endorses fetal movement. Patient denies abdominal cramping or contractions.  Patient2 denies vaginal concerns including abnormal discharge, leaking of fluid, and bleeding.  Contractions: Irritability. Vag. Bleeding: None.  Movement: Present.  Patient states she has been taking off of work because of her pregnancy symptoms including nausea, HAs, and vision changes.  She reports this results in her needing to "leave the desk" frequently.   Patient states she seen an eye doctor prior to moving and was prescribed a new vision prescription.  She states she does wear her glasses, but does not have them on currently and states "I normally use them when my eyes start to strain."   Reviewed past medical,surgical, social, obstetrical and family history as well as problem list, medications and allergies.  Objective   Vitals:   10/02/20 1054  BP: 118/80  Pulse: (!) 101  Weight: 217 lb (98.4 kg)  Body mass index is 36.11 kg/m.  Total Weight Gain:32 lb (14.5 kg)         Physical Examination:   General appearance: Well appearing, and in no distress  Mental status: Alert, oriented to person, place, and time  Skin: Warm & dry  Cardiovascular: Normal heart rate noted  Respiratory: Normal respiratory effort, no distress  Abdomen: Soft, gravid, nontender, AGA with Fundal Height: 39 cm  Pelvic: Cervical exam deferred           Extremities: Edema:  Trace  Fetal Status: Fetal Heart Rate (bpm): 144  Movement: Present   No results found for this or any previous visit (from the past 24 hour(s)).  Assessment & Plan:  Low-risk pregnancy of a 25 y.o., G3P1001 at [redacted]w[redacted]d with an Estimated Date of Delivery: 10/12/20   1. Supervision of other normal pregnancy, antepartum -Anticipatory guidance for upcoming appts. -Patient to next appt in 1 weeks for an in-person visit. -Reviewed request for LOA from work. -Patient reports she gets 6 weeks off for VD and 8 weeks off for CS. -Informed that she can come out of work on Monday with understanding that this will cut into time for bonding with infant. -Patient verbalizes understanding. -Papers modified for OOW on 4/25 and to RTW after PP appt-actual date TBD.   2. [redacted] weeks gestation of pregnancy -Doing well overall -Reassured that normal to have reoccurrence of symptoms that are typical of 1st trimester in 3rd trimester. -Encouraged to monitor and report worsening.       Meds: No orders of the defined types were placed in this encounter.  Labs/procedures today:  Lab Orders  No laboratory test(s) ordered today     Reviewed: Term labor symptoms and general obstetric precautions including but not limited to vaginal bleeding, contractions, leaking of fluid and fetal movement were reviewed in detail with the patient.  All questions were answered.  Follow-up: Return in about 1 week (around 10/09/2020) for LROB.  No orders of the defined types were placed in this encounter.  Cherre Robins MSN, CNM 10/02/2020

## 2020-10-11 ENCOUNTER — Other Ambulatory Visit: Payer: Self-pay

## 2020-10-11 ENCOUNTER — Ambulatory Visit (INDEPENDENT_AMBULATORY_CARE_PROVIDER_SITE_OTHER): Payer: BC Managed Care – PPO

## 2020-10-11 ENCOUNTER — Ambulatory Visit: Payer: BC Managed Care – PPO

## 2020-10-11 VITALS — BP 128/79 | HR 96 | Wt 220.0 lb

## 2020-10-11 DIAGNOSIS — Z3480 Encounter for supervision of other normal pregnancy, unspecified trimester: Secondary | ICD-10-CM | POA: Diagnosis not present

## 2020-10-11 DIAGNOSIS — O99013 Anemia complicating pregnancy, third trimester: Secondary | ICD-10-CM | POA: Diagnosis not present

## 2020-10-11 DIAGNOSIS — Z3A39 39 weeks gestation of pregnancy: Secondary | ICD-10-CM | POA: Diagnosis not present

## 2020-10-11 DIAGNOSIS — R8271 Bacteriuria: Secondary | ICD-10-CM

## 2020-10-11 NOTE — Progress Notes (Signed)
   LOW-RISK PREGNANCY OFFICE VISIT  Patient name: Laura Roy MRN [id number]  Date of birth: [birth year] Chief Complaint:   Routine Prenatal Visit (/)  Subjective:   Laura Roy is a 25 y.o. G61P1001 female at [redacted]w[redacted]d with an Estimated Date of Delivery: 10/12/20 being seen today for ongoing management of a low-risk pregnancy aeb has Supervision of normal pregnancy, antepartum; GBS bacteriuria; LGSIL on Pap smear of cervix; Abnormal fetal ultrasound; and Anemia in pregnancy on their problem list.  Patient presents today with fatigue.  Patient endorses fetal movement. Patient denies abdominal cramping or contractions.  Patient denies vaginal concerns including abnormal discharge, leaking of fluid, and bleeding.  Contractions: Not present. Vag. Bleeding: None.  Movement: Present.  Reviewed past medical,surgical, social, obstetrical and family history as well as problem list, medications and allergies.  Objective   Vitals:   10/11/20 0833  BP: 128/79  Pulse: 96  Weight: 220 lb (99.8 kg)  Body mass index is 36.61 kg/m.  Total Weight Gain:35 lb (15.9 kg)         Physical Examination:   General appearance: Well appearing, and in no distress  Mental status: Alert, oriented to person, place, and time  Skin: Warm & dry  Cardiovascular: Normal heart rate noted  Respiratory: Normal respiratory effort, no distress  Abdomen: Soft, gravid, nontender, AGA with Fundal Height: 39 cm  Pelvic: Cervical exam deferred           Extremities: Edema: None  Fetal Status: Fetal Heart Rate (bpm): 145  Movement: Present   No results found for this or any previous visit (from the past 24 hour(s)).  Assessment & Plan:  Low-risk pregnancy of a 25 y.o., G3P1001 at [redacted]w[redacted]d with an Estimated Date of Delivery: 10/12/20   1. Supervision of other normal pregnancy, antepartum -Anticipatory guidance for upcoming appts. -Patient to next appt in 1 weeks for a ROB visit if the schedule allots. -If no schedule  availability patient to return in 5 weeks for PPV.   2. [redacted] weeks gestation of pregnancy -Doing well with exception of fatigue. -Discussed IOL at 41 weeks. -Discussed r/b of induction including fetal distress, serial induction, pain, and increased risk of c/s delivery -Discussed induction methods including cervical ripening agents, foley bulbs, and pitocin. -Schedule request placed for Friday May 6th at [facility] -Admit orders placed. -Patient informed that labor may occur before this time and that is okay.   3. GBS bacteriuria -Treat during labor  4. Anemia during pregnancy in third trimester -Taking iron supplement     Meds: No orders of the defined types were placed in this encounter.  Labs/procedures today:  Lab Orders  No laboratory test(s) ordered today     Reviewed: Term labor symptoms and general obstetric precautions including but not limited to vaginal bleeding, contractions, leaking of fluid and fetal movement were reviewed in detail with the patient.  All questions were answered.  Follow-up: Return in about 1 week (around 10/18/2020) for LROB.  No orders of the defined types were placed in this encounter.  Cherre Robins MSN, CNM 10/11/2020

## 2020-10-14 ENCOUNTER — Telehealth (HOSPITAL_COMMUNITY): Payer: Self-pay | Admitting: *Deleted

## 2020-10-14 ENCOUNTER — Other Ambulatory Visit: Payer: Self-pay | Admitting: Advanced Practice Midwife

## 2020-10-14 ENCOUNTER — Encounter (HOSPITAL_COMMUNITY): Payer: Self-pay | Admitting: *Deleted

## 2020-10-14 NOTE — Telephone Encounter (Signed)
Preadmission screen  

## 2020-10-16 ENCOUNTER — Other Ambulatory Visit (HOSPITAL_COMMUNITY)
Admission: RE | Admit: 2020-10-16 | Discharge: 2020-10-16 | Disposition: A | Discharge status: Home / Self Care | Payer: BC Managed Care – PPO | Source: Ambulatory Visit | Attending: Family Medicine | Admitting: Family Medicine

## 2020-10-16 DIAGNOSIS — Z20822 Contact with and (suspected) exposure to covid-19: Secondary | ICD-10-CM | POA: Insufficient documentation

## 2020-10-16 DIAGNOSIS — Z01812 Encounter for preprocedural laboratory examination: Secondary | ICD-10-CM | POA: Insufficient documentation

## 2020-10-16 LAB — SARS CORONAVIRUS 2 (TAT 6-24 HRS): SARS Coronavirus 2: NEGATIVE

## 2020-10-18 ENCOUNTER — Inpatient Hospital Stay (HOSPITAL_COMMUNITY): Payer: BC Managed Care – PPO | Admitting: Anesthesiology

## 2020-10-18 ENCOUNTER — Inpatient Hospital Stay (HOSPITAL_COMMUNITY): Payer: BC Managed Care – PPO

## 2020-10-18 ENCOUNTER — Encounter: Payer: BC Managed Care – PPO | Admitting: Obstetrics

## 2020-10-18 ENCOUNTER — Inpatient Hospital Stay (HOSPITAL_COMMUNITY)
Admission: AD | Admit: 2020-10-18 | Discharge: 2020-10-20 | DRG: 807 | Disposition: A | Discharge status: Home / Self Care | Payer: BC Managed Care – PPO | Attending: Obstetrics and Gynecology | Admitting: Obstetrics and Gynecology

## 2020-10-18 ENCOUNTER — Other Ambulatory Visit: Payer: Self-pay

## 2020-10-18 ENCOUNTER — Encounter (HOSPITAL_COMMUNITY): Payer: Self-pay | Admitting: Family Medicine

## 2020-10-18 DIAGNOSIS — Z3490 Encounter for supervision of normal pregnancy, unspecified, unspecified trimester: Secondary | ICD-10-CM

## 2020-10-18 DIAGNOSIS — Z3A40 40 weeks gestation of pregnancy: Secondary | ICD-10-CM

## 2020-10-18 DIAGNOSIS — O99214 Obesity complicating childbirth: Secondary | ICD-10-CM | POA: Diagnosis present

## 2020-10-18 DIAGNOSIS — O9902 Anemia complicating childbirth: Secondary | ICD-10-CM

## 2020-10-18 DIAGNOSIS — O480 Post-term pregnancy: Principal | ICD-10-CM | POA: Diagnosis present

## 2020-10-18 DIAGNOSIS — O99013 Anemia complicating pregnancy, third trimester: Secondary | ICD-10-CM

## 2020-10-18 DIAGNOSIS — O99019 Anemia complicating pregnancy, unspecified trimester: Secondary | ICD-10-CM

## 2020-10-18 DIAGNOSIS — Z20822 Contact with and (suspected) exposure to covid-19: Secondary | ICD-10-CM | POA: Diagnosis present

## 2020-10-18 DIAGNOSIS — O99824 Streptococcus B carrier state complicating childbirth: Secondary | ICD-10-CM | POA: Diagnosis present

## 2020-10-18 DIAGNOSIS — R87612 Low grade squamous intraepithelial lesion on cytologic smear of cervix (LGSIL): Secondary | ICD-10-CM | POA: Diagnosis present

## 2020-10-18 DIAGNOSIS — R8271 Bacteriuria: Secondary | ICD-10-CM | POA: Diagnosis present

## 2020-10-18 DIAGNOSIS — O283 Abnormal ultrasonic finding on antenatal screening of mother: Secondary | ICD-10-CM | POA: Diagnosis present

## 2020-10-18 DIAGNOSIS — D649 Anemia, unspecified: Secondary | ICD-10-CM

## 2020-10-18 DIAGNOSIS — O358XX0 Maternal care for other (suspected) fetal abnormality and damage, not applicable or unspecified: Secondary | ICD-10-CM | POA: Diagnosis present

## 2020-10-18 DIAGNOSIS — Z3480 Encounter for supervision of other normal pregnancy, unspecified trimester: Secondary | ICD-10-CM

## 2020-10-18 LAB — RPR: RPR Ser Ql: NONREACTIVE

## 2020-10-18 LAB — CBC
HCT: 31.4 % — ABNORMAL LOW (ref 36.0–46.0)
Hemoglobin: 9.6 g/dL — ABNORMAL LOW (ref 12.0–15.0)
MCH: 24.7 pg — ABNORMAL LOW (ref 26.0–34.0)
MCHC: 30.6 g/dL (ref 30.0–36.0)
MCV: 80.7 fL (ref 80.0–100.0)
Platelets: 340 10*3/uL (ref 150–400)
RBC: 3.89 MIL/uL (ref 3.87–5.11)
RDW: 15.7 % — ABNORMAL HIGH (ref 11.5–15.5)
WBC: 6.2 10*3/uL (ref 4.0–10.5)
nRBC: 0 % (ref 0.0–0.2)

## 2020-10-18 LAB — TYPE AND SCREEN
ABO/RH(D): O POS
Antibody Screen: NEGATIVE

## 2020-10-18 MED ORDER — FENTANYL-BUPIVACAINE-NACL 0.5-0.125-0.9 MG/250ML-% EP SOLN: 12.0000 mL/h | EPIDURAL | Status: DC | PRN

## 2020-10-18 MED ORDER — EPHEDRINE 5 MG/ML INJ: 10.0000 mg | INTRAVENOUS | Status: DC | PRN

## 2020-10-18 MED ORDER — ONDANSETRON HCL 4 MG PO TABS
4.0000 mg | ORAL_TABLET | ORAL | Status: DC | PRN
Start: 2020-10-18 — End: 2020-10-20

## 2020-10-18 MED ORDER — MEASLES, MUMPS & RUBELLA VAC IJ SOLR: 0.5000 mL | Freq: Once | INTRAMUSCULAR | Status: DC

## 2020-10-18 MED ORDER — COCONUT OIL OIL: 1.0000 "application " | TOPICAL_OIL | Status: DC | PRN

## 2020-10-18 MED ORDER — FENTANYL-BUPIVACAINE-NACL 0.5-0.125-0.9 MG/250ML-% EP SOLN
EPIDURAL | Status: DC | PRN
  Administered 2020-10-18: 12 mL/h via EPIDURAL

## 2020-10-18 MED ORDER — MEDROXYPROGESTERONE ACETATE 150 MG/ML IM SUSP: 150.0000 mg | INTRAMUSCULAR | Status: DC | PRN

## 2020-10-18 MED ORDER — LIDOCAINE HCL (PF) 1 % IJ SOLN
INTRAMUSCULAR | Status: DC | PRN
  Administered 2020-10-18: 6 mL via EPIDURAL

## 2020-10-18 MED ORDER — ONDANSETRON HCL 4 MG/2ML IJ SOLN: 4.0000 mg | Freq: Four times a day (QID) | INTRAMUSCULAR | Status: DC | PRN

## 2020-10-18 MED ORDER — IBUPROFEN 600 MG PO TABS
600.0000 mg | ORAL_TABLET | Freq: Four times a day (QID) | ORAL | Status: DC
  Administered 2020-10-19 – 2020-10-20 (×7): 600 mg via ORAL
  Filled 2020-10-18 (×7): qty 1

## 2020-10-18 MED ORDER — SOD CITRATE-CITRIC ACID 500-334 MG/5ML PO SOLN: 30.0000 mL | ORAL | Status: DC | PRN

## 2020-10-18 MED ORDER — DIPHENHYDRAMINE HCL 25 MG PO CAPS: 25.0000 mg | ORAL_CAPSULE | Freq: Four times a day (QID) | ORAL | Status: DC | PRN

## 2020-10-18 MED ORDER — SIMETHICONE 80 MG PO CHEW: 80.0000 mg | CHEWABLE_TABLET | ORAL | Status: DC | PRN

## 2020-10-18 MED ORDER — PHENYLEPHRINE 40 MCG/ML (10ML) SYRINGE FOR IV PUSH (FOR BLOOD PRESSURE SUPPORT): 80.0000 ug | PREFILLED_SYRINGE | INTRAVENOUS | Status: DC | PRN

## 2020-10-18 MED ORDER — LACTATED RINGERS IV SOLN: 500.0000 mL | Freq: Once | INTRAVENOUS | Status: DC

## 2020-10-18 MED ORDER — WITCH HAZEL-GLYCERIN EX PADS
1.0000 | MEDICATED_PAD | CUTANEOUS | Status: DC | PRN
Start: 2020-10-18 — End: 2020-10-20

## 2020-10-18 MED ORDER — DIPHENHYDRAMINE HCL 50 MG/ML IJ SOLN: 12.5000 mg | INTRAMUSCULAR | Status: DC | PRN

## 2020-10-18 MED ORDER — ONDANSETRON HCL 4 MG/2ML IJ SOLN: 4.0000 mg | INTRAMUSCULAR | Status: DC | PRN

## 2020-10-18 MED ORDER — FENTANYL CITRATE (PF) 100 MCG/2ML IJ SOLN: 50.0000 ug | INTRAMUSCULAR | Status: DC | PRN

## 2020-10-18 MED ORDER — PRENATAL MULTIVITAMIN CH
1.0000 | ORAL_TABLET | Freq: Every day | ORAL | Status: DC
  Administered 2020-10-19 – 2020-10-20 (×2): 1 via ORAL
  Filled 2020-10-18 (×2): qty 1

## 2020-10-18 MED ORDER — BENZOCAINE-MENTHOL 20-0.5 % EX AERO: 1.0000 "application " | INHALATION_SPRAY | CUTANEOUS | Status: DC | PRN

## 2020-10-18 MED ORDER — OXYTOCIN-SODIUM CHLORIDE 30-0.9 UT/500ML-% IV SOLN
2.5000 [IU]/h | INTRAVENOUS | Status: DC
  Administered 2020-10-18: 2.5 [IU]/h via INTRAVENOUS
  Filled 2020-10-18: qty 500

## 2020-10-18 MED ORDER — PENICILLIN G POT IN DEXTROSE 60000 UNIT/ML IV SOLN
3.0000 10*6.[IU] | INTRAVENOUS | Status: DC
  Administered 2020-10-18 (×4): 3 10*6.[IU] via INTRAVENOUS
  Filled 2020-10-18 (×4): qty 50

## 2020-10-18 MED ORDER — OXYTOCIN BOLUS FROM INFUSION: 333.0000 mL | Freq: Once | INTRAVENOUS | Status: DC

## 2020-10-18 MED ORDER — SODIUM CHLORIDE 0.9 % IV SOLN
5.0000 10*6.[IU] | Freq: Once | INTRAVENOUS | Status: AC
  Administered 2020-10-18: 5 10*6.[IU] via INTRAVENOUS
  Filled 2020-10-18: qty 5

## 2020-10-18 MED ORDER — LACTATED RINGERS IV SOLN: 500.0000 mL | INTRAVENOUS | Status: DC | PRN

## 2020-10-18 MED ORDER — LIDOCAINE HCL (PF) 1 % IJ SOLN: 30.0000 mL | INTRAMUSCULAR | Status: DC | PRN

## 2020-10-18 MED ORDER — SENNOSIDES-DOCUSATE SODIUM 8.6-50 MG PO TABS
2.0000 | ORAL_TABLET | Freq: Every day | ORAL | Status: DC
  Administered 2020-10-19 – 2020-10-20 (×2): 2 via ORAL
  Filled 2020-10-18 (×2): qty 2

## 2020-10-18 MED ORDER — ACETAMINOPHEN 325 MG PO TABS
650.0000 mg | ORAL_TABLET | ORAL | Status: DC | PRN
  Administered 2020-10-19 – 2020-10-20 (×2): 650 mg via ORAL
  Filled 2020-10-18 (×2): qty 2

## 2020-10-18 MED ORDER — ACETAMINOPHEN 325 MG PO TABS: 650.0000 mg | ORAL_TABLET | ORAL | Status: DC | PRN

## 2020-10-18 MED ORDER — TETANUS-DIPHTH-ACELL PERTUSSIS 5-2.5-18.5 LF-MCG/0.5 IM SUSY: 0.5000 mL | PREFILLED_SYRINGE | Freq: Once | INTRAMUSCULAR | Status: DC

## 2020-10-18 MED ORDER — LACTATED RINGERS IV SOLN: INTRAVENOUS | Status: DC

## 2020-10-18 MED ORDER — FENTANYL-BUPIVACAINE-NACL 0.5-0.125-0.9 MG/250ML-% EP SOLN
12.0000 mL/h | EPIDURAL | Status: DC | PRN
  Filled 2020-10-18: qty 250

## 2020-10-18 MED ORDER — DIBUCAINE (PERIANAL) 1 % EX OINT: 1.0000 "application " | TOPICAL_OINTMENT | CUTANEOUS | Status: DC | PRN

## 2020-10-18 MED ORDER — TERBUTALINE SULFATE 1 MG/ML IJ SOLN: 0.2500 mg | Freq: Once | INTRAMUSCULAR | Status: DC | PRN

## 2020-10-18 MED ORDER — OXYTOCIN-SODIUM CHLORIDE 30-0.9 UT/500ML-% IV SOLN
1.0000 m[IU]/min | INTRAVENOUS | Status: DC
  Administered 2020-10-18: 2 m[IU]/min via INTRAVENOUS
  Filled 2020-10-18: qty 500

## 2020-10-18 NOTE — Progress Notes (Signed)
Labor Progress Note Laura Roy is a 25 y.o. G3P1001 at [redacted]w[redacted]d presented for IOL for PD  S:  Feeling some cramping. Declines pain meds.  O:  BP 123/79   Pulse 89   Temp 98.9 F (37.2 C) (Oral)   Resp 18   Ht 5\' 5"  (1.651 m)   Wt 99.4 kg   LMP 12/19/2019   BMI 36.46 kg/m  EFM: baseline 135 bpm/ mod variability/ + accels/ no decels  Toco/IUPC: 1.5-4 SVE: deferred Pitocin: 10 mu/min  [name]: 25 y.o. G3P1001 [redacted]w[redacted]d  1. Labor: latent 2. FWB: Cat I 3. Pain: analgesia/anesthesia prn  Continue Pitocin titration. Anticipate labor progress and SVD.  [redacted]w[redacted]d, CNM 9:33 AM

## 2020-10-18 NOTE — Anesthesia Preprocedure Evaluation (Signed)
Anesthesia Evaluation  Patient identified by MRN, date of birth, ID band Patient awake    Reviewed: Allergy & Precautions, H&P , NPO status , Patient's Chart, lab work & pertinent test results, reviewed documented beta blocker date and time   Airway Mallampati: III  TM Distance: >3 FB Neck ROM: full    Dental no notable dental hx. (+) Teeth Intact, Dental Advisory Given   Pulmonary neg pulmonary ROS,    Pulmonary exam normal breath sounds clear to auscultation       Cardiovascular negative cardio ROS Normal cardiovascular exam Rhythm:regular Rate:Normal     Neuro/Psych negative neurological ROS  negative psych ROS   GI/Hepatic negative GI ROS, Neg liver ROS,   Endo/Other  Morbid obesity  Renal/GU negative Renal ROS  negative genitourinary   Musculoskeletal   Abdominal   Peds  Hematology  (+) Blood dyscrasia, anemia ,   Anesthesia Other Findings   Reproductive/Obstetrics (+) Pregnancy                             Anesthesia Physical Anesthesia Plan  ASA: II  Anesthesia Plan: Epidural   Post-op Pain Management:    Induction:   PONV Risk Score and Plan:   Airway Management Planned:   Additional Equipment:   Intra-op Plan:   Post-operative Plan:   Informed Consent: I have reviewed the patients History and Physical, chart, labs and discussed the procedure including the risks, benefits and alternatives for the proposed anesthesia with the patient or authorized representative who has indicated his/her understanding and acceptance.       Plan Discussed with:   Anesthesia Plan Comments:         Anesthesia Quick Evaluation

## 2020-10-18 NOTE — Progress Notes (Signed)
Patient ID: Laura Roy, female   DOB: 1995-11-17, 25 y.o.   MRN: [id number]  In to introduce self to pt; she just became complete and started pushing- doing well  BP 119/77 FHR 120s, +accels, occ variables w pushing Ctx q 2 mins with Pit at 57mu/min Vtx +2  IUP@40 .6wks Pushing  Continue pushing w ctx Anticipate vag del  [name] CNM 10/18/2020

## 2020-10-18 NOTE — H&P (Addendum)
OBSTETRIC ADMISSION HISTORY AND PHYSICAL  Kirin Brandenburger is a 25 y.o. female G3P1001 with IUP at [redacted]w[redacted]d by 12 week presenting for IOL for Post-dates. She reports +FMs, No LOF, no VB, no blurry vision, headaches or peripheral edema, and RUQ pain.  She plans on breast feeding. She request POP's for birth control. She received her prenatal care at [facility] transferred from [place].    Dating: By 00Q6P --->  Estimated Date of Delivery: 10/12/20  Sono:    @[redacted]w[redacted]d , CWD, normal anatomy, Cephalic presentation, Anterior lie, 2983g, 52% EFW   Prenatal History/Complications:  Limited Pre-Natal Care Right Pyelectasis of fetus on prenatal ultrasound   Past Medical History: Past Medical History:  Diagnosis Date  . Medical history non-contributory     Past Surgical History: Past Surgical History:  Procedure Laterality Date  . NO PAST SURGERIES      Obstetrical History: OB History    Gravida  3   Para  1   Term  1   Preterm      AB      Living  1     SAB      IAB      Ectopic      Multiple      Live Births  1           Social History Social History   Socioeconomic History  . Marital status: Single    Spouse name: Not on file  . Number of children: Not on file  . Years of education: Not on file  . Highest education level: Not on file  Occupational History  . Not on file  Tobacco Use  . Smoking status: Never Smoker  . Smokeless tobacco: Never Used  Vaping Use  . Vaping Use: Never used  Substance and Sexual Activity  . Alcohol use: Not Currently  . Drug use: Never  . Sexual activity: Yes    Partners: Male  Other Topics Concern  . Not on file  Social History Narrative  . Not on file   Social Determinants of Health   Financial Resource Strain: Not on file  Food Insecurity: Not on file  Transportation Needs: Not on file  Physical Activity: Not on file  Stress: Not on file  Social Connections: Not on file    Family History: Family History   Problem Relation Age of Onset  . Kidney Stones Mother     Allergies: No Known Allergies  Medications Prior to Admission  Medication Sig Dispense Refill Last Dose  . acetaminophen (TYLENOL) 325 MG tablet Take 650 mg by mouth every 6 (six) hours as needed for headache, mild pain or fever.     . famotidine (PEPCID) 20 MG tablet Take 20 mg by mouth 2 (two) times daily as needed for heartburn or indigestion.     . ferrous sulfate 324 (65 Fe) MG TBEC Take 1 tablet (325 mg total) by mouth every other day. 30 tablet 1      Review of Systems   All systems reviewed and negative except as stated in HPI  Blood pressure 127/80, pulse 88, temperature 98 F (36.7 C), temperature source Oral, resp. rate 17, height 5\' 5"  (1.651 m), weight 99.4 kg, last menstrual period 12/19/2019. General appearance: alert and cooperative Lungs: breathing comfortably and normally Heart: regular rate and rhythm Abdomen: soft, non-tender; bowel sounds normal Presentation: cephalic Fetal monitoringBaseline: 145 bpm, Variability: Good {> 6 bpm), Accelerations: Reactive and Decelerations: Absent Uterine activityDate/time of onset: 10/17/20, infrequent Dilation: 2  Effacement (%): 60 Station: -3 Exam by:: Annie Main, RN   Prenatal labs: ABO, Rh:  O+ Antibody:  Negative Rubella:  Immune RPR: Non Reactive (04/06 1306)  HBsAg:   Negative HIV: Non Reactive (04/06 1306)  GBS:   pos (bacteruria)  1 hr Glucola Negative Genetic screening  Normal Anatomy US Normal  Prenatal Transfer Tool  Maternal Diabetes: No Genetic Screening: Normal Maternal Ultrasounds/Referrals: Fetal Kidney Anomalies Fetal Ultrasounds or other Referrals:  None Maternal Substance Abuse:  No Significant Maternal Medications:  None Significant Maternal Lab Results: None  No results found for this or any previous visit (from the past 24 hour(s)).  Patient Active Problem List   Diagnosis Date Noted  . Anemia in pregnancy 09/21/2020   . LGSIL on Pap smear of cervix 09/18/2020  . Abnormal fetal ultrasound 09/18/2020  . Supervision of normal pregnancy, antepartum 09/05/2020  . GBS bacteriuria 09/05/2020    Assessment/Plan:  Charmion Hapke is a 25 y.o. G3P1001 at [redacted]w[redacted]d here for IOL for postadtes.  #Induction:  Given favorable cervix, will start on Pitocin. #Pain: PRN, wants epidural #FWB: Category 1 #ID: GBS Positive, will start PCN  #MOF: Breast #MOC: Unsure #Circ: N/A #LSIL:  Pap on 04/10/19 showed LSIL. Repeat Pap in 2021 was normal. HPV testing not done. Consider Colpo or repeat Pap Post-partum. #Right Pyelectasis of fetus:  Post-partum Ultrasound via Peds team.  Jovita Kussmaul, MD  10/18/2020, 2:26 AM  I saw and evaluated the patient. I agree with the findings and the plan of care as documented in the resident's note.  Casper Harrison, MD [facility] Family Medicine Fellow, [facility] for [facility], [facility]

## 2020-10-18 NOTE — Lactation Note (Addendum)
This note was copied from a baby's chart. Lactation Consultation Note  Patient Name: Laura Roy [id number] Date: 10/18/2020 Reason for consult: L&D Initial assessment;Mother's request;Difficult latch;Primapara;1st time breastfeeding;Term Age:27 hours   Mom listed as a  No on L and D board but after delivery she called for [facility] assistance.   LC assisted Mom trying to latch infant in cross cradle on the left breasts. Mom's nipples are flat but will erect with stimulation. Infant has amniotic fluid in her mouth making it hard to latch. LC wiped out her mouth and she did open wide but did not latch.  Mom could use breast shells to help extend out her nipple.  LC to provide further support on the floor. Infant left in close proximity of the breast for latching.  Mom taught hand expression to offer drops of colostrum, no drops noted at the time of the visit.     Maternal Data Has patient been taught Hand Expression?: Yes Does the patient have breastfeeding experience prior to this delivery?: No  Feeding Mother's Current Feeding Choice: Breast Milk  LATCH Score Latch: Repeated attempts needed to sustain latch, nipple held in mouth throughout feeding, stimulation needed to elicit sucking reflex.  Audible Swallowing: None  Type of Nipple: Flat  Comfort (Breast/Nipple): Soft / non-tender  Hold (Positioning): Assistance needed to correctly position infant at breast and maintain latch.  LATCH Score: 5   Lactation Tools Discussed/Used    Interventions Interventions: Breast feeding basics reviewed;Breast compression;Assisted with latch;Adjust position;Skin to skin;Support pillows;Breast massage;Position options;Hand express;Expressed milk;Education  Discharge WIC Program: Yes  Consult Status Consult Status: Follow-up Date: 10/19/20 Follow-up type: In-patient    [name]  [name] 10/18/2020, 10:11 PM

## 2020-10-18 NOTE — Discharge Summary (Signed)
Postpartum Discharge Summary  Date of Service updated 10/20/20     Patient Name: Laura Roy DOB: 10/11/1995 MRN: [id number]  Date of admission: 10/18/2020 Delivery date:10/18/2020  Delivering provider: Delora Fuel  Date of discharge: 10/20/2020  Admitting diagnosis: Indication for care or intervention in labor or delivery [O75.9] Intrauterine pregnancy: [redacted]w[redacted]d    Secondary diagnosis:  Active Problems:   Supervision of normal pregnancy, antepartum   GBS bacteriuria   LGSIL on Pap smear of cervix   Abnormal fetal ultrasound   Anemia in pregnancy   Post-dates pregnancy   Vaginal delivery  Additional problems: none    Discharge diagnosis: Term Pregnancy Delivered                                              Post partum procedures:none Augmentation: AROM and Pitocin Complications: None  Hospital course: Induction of Labor With Vaginal Delivery   25y.o. yo G3P2002 at 415w6das admitted to the hospital 10/18/2020 for induction of labor.  Indication for induction: Postdates.  Patient had an uncomplicated labor course, including adequate GBS ppx and an epidural placement prior to delivering. Membrane Rupture Time/Date: 5:55 PM ,10/18/2020   Delivery Method:Vaginal, Spontaneous  Episiotomy: None  Lacerations:  None  Details of delivery can be found in separate delivery note.  Patient had a routine postpartum course. Patient is discharged home 10/20/20.  Newborn Data: Birth date:10/18/2020  Birth time:9:11 PM  Gender:Female  Living status:Living  Apgars:9 ,10  Weight:3150 g (6lb 15.1oz)  Magnesium Sulfate received: No BMZ received: No Rhophylac:N/A MMR:N/A T-DaP:offered postpartum Flu: Yes Transfusion:No  Physical exam  Vitals:   10/19/20 0839 10/19/20 1325 10/19/20 2049 10/20/20 0555  BP: 115/74 130/88 130/81 117/73  Pulse: 91 89 79 78  Resp: _0 Temp: 98.8 F (37.1 C) 98.3 F (36.8 C) 98 F (36.7 C) 98.5 F (36.9 C)  TempSrc: Oral Oral Oral Oral   SpO2:   100% 100%  Weight:      Height:       General: alert, cooperative and no distress Lochia: appropriate Uterine Fundus: firm Incision: N/A DVT Evaluation: No evidence of DVT seen on physical exam. Labs: Lab Results  Component Value Date   WBC 6.2 10/18/2020   HGB 9.6 (L) 10/18/2020   HCT 31.4 (L) 10/18/2020   MCV 80.7 10/18/2020   PLT 340 10/18/2020   CMP Latest Ref Rng & Units 09/18/2020  Glucose 65 - 99 mg/dL 87  BUN 6 - 20 mg/dL 3(L)  Creatinine 0.57 - 1.00 mg/dL 0.53(L)  Sodium 134 - 144 mmol/L 138  Potassium 3.5 - 5.2 mmol/L 4.2  Chloride 96 - 106 mmol/L 103  CO2 20 - 29 mmol/L 18(L)  Calcium 8.7 - 10.2 mg/dL 9.0  Total Protein 6.0 - 8.5 g/dL 6.6  Total Bilirubin 0.0 - 1.2 mg/dL <0.2  Alkaline Phos 44 - 121 IU/L 100  AST 0 - 40 IU/L 13  ALT 0 - 32 IU/L 14   Edinburgh Score: Edinburgh Postnatal Depression Scale Screening Tool 10/20/2020  I have been able to laugh and see the funny side of things. 0  I have looked forward with enjoyment to things. 0  I have blamed myself unnecessarily when things went wrong. 0  I have been anxious or worried for no good reason. 0  I have felt scared or  panicky for no good reason. 0  Things have been getting on top of me. 0  I have been so unhappy that I have had difficulty sleeping. 0  I have felt sad or miserable. 0  I have been so unhappy that I have been crying. 0  The thought of harming myself has occurred to me. 0  Edinburgh Postnatal Depression Scale Total 0     After visit meds:  Allergies as of 10/20/2020   No Known Allergies     Medication List    TAKE these medications   acetaminophen 500 MG tablet Commonly known as: TYLENOL Take 2 tablets (1,000 mg total) by mouth every 6 (six) hours as needed for mild pain, fever or headache. What changed:   medication strength  how much to take   ascorbic acid 250 MG tablet Commonly known as: VITAMIN C Take 1 tablet (250 mg total) by mouth every other day. Start  taking on: Oct 21, 2020   coconut oil Oil Apply 1 application topically as needed.   famotidine 20 MG tablet Commonly known as: PEPCID Take 20 mg by mouth 2 (two) times daily as needed for heartburn or indigestion.   ferrous sulfate 324 (65 Fe) MG Tbec Take 1 tablet (325 mg total) by mouth every other day.   ibuprofen 600 MG tablet Commonly known as: ADVIL Take 1 tablet (600 mg total) by mouth every 6 (six) hours as needed.        Discharge home in stable condition Infant Feeding: Bottle and Breast Infant Disposition:home with mother Discharge instruction: per After Visit Summary and Postpartum booklet. Activity: Advance as tolerated. Pelvic rest for 6 weeks.  Diet: routine diet Future Appointments: Future Appointments  Date Time Provider [place]  11/15/2020  8:30 AM Rasch, Artist Pais, NP [place] None   Follow up Visit:  Myrtis Ser, CNM  P [facility] Please schedule this patient for Postpartum visit in: 4 weeks with the following provider: Any provider  In-Person  For C/S patients schedule nurse incision check in weeks 2 weeks: no  Low risk pregnancy complicated by: anemia, GBS+  Delivery mode: SVD  Anticipated Birth Control: other/unsure  PP Procedures needed: (colpo mentioned, but NILM Pap Oct 2021 in [place])  Schedule Integrated [place] visit: no  11/21/319 Arrie Senate, MD

## 2020-10-18 NOTE — Progress Notes (Signed)
Patient states that she dos not want to see lactation at this time.

## 2020-10-18 NOTE — Progress Notes (Signed)
Labor Progress Note Laura Roy is a 25 y.o. G3P1001 at [redacted]w[redacted]d presented for IOL for PD  S:  Comfortable with epidural. No c/o.   O:  BP (!) 102/56   Pulse 77   Temp 98 F (36.7 C) (Oral)   Resp 16   Ht 5\' 5"  (1.651 m)   Wt 99.4 kg   LMP 12/19/2019   SpO2 99%   BMI 36.46 kg/m  EFM: baseline 135 bpm/ mod variability/ no accels/ no decels  Toco/IUPC: 2-3 SVE: Dilation: 4 Effacement (%): 70,80 Cervical Position: Posterior Station: -2 Presentation: Vertex Exam by:: [email or web address], RNC Pitocin: 20 mu/min  [name]: 25 y.o. G3P1001 [redacted]w[redacted]d  1. Labor: early 2. FWB: Cat I 3. Pain: epidural  Consented for AROM, AROM for small amt of clear fluid. Continue Pitocin. Anticipate labor progress and SVD.  [redacted]w[redacted]d, CNM 6:02 PM

## 2020-10-18 NOTE — Progress Notes (Signed)
Labor Progress Note Laura Roy is a 25 y.o. G3P1001 at [redacted]w[redacted]d presented for IOL for PD  S:  Feeling more ctx, bouncing on ball. Requesting epidural.   O:  BP 116/65   Pulse 86   Temp 98.9 F (37.2 C) (Oral)   Resp 18   Ht 5\' 5"  (1.651 m)   Wt 99.4 kg   LMP 12/19/2019   BMI 36.46 kg/m  EFM: baseline 135 bpm/ mod variability/ + accels/ no decels  Toco/IUPC: indeterminable SVE: Dilation: 3 Effacement (%): 70 Cervical Position: Posterior Station: -3 Presentation: Vertex Exam by:: [email or web address], RN Pitocin: 14 mu/min  [name]: 25 y.o. G3P1001 [redacted]w[redacted]d  1. Labor: latent 2. FWB: Cat I 3. Pain: analgesia/anesthesia/NO prn  Continue Pitocin. Will have epidural placed. Consider AROM. Anticipate labor progress and SVD.  [redacted]w[redacted]d, CNM 2:26 PM

## 2020-10-18 NOTE — Anesthesia Procedure Notes (Signed)
Epidural Patient location during procedure: OB Start time: 10/18/2020 3:49 PM End time: 10/18/2020 3:55 PM  Staffing Anesthesiologist: Bethena Midget, MD  Preanesthetic Checklist Completed: patient identified, IV checked, site marked, risks and benefits discussed, surgical consent, monitors and equipment checked, pre-op evaluation and timeout performed  Epidural Patient position: sitting Prep: DuraPrep and site prepped and draped Patient monitoring: continuous pulse ox and blood pressure Approach: midline Location: L3-L4 Injection technique: LOR air  Needle:  Needle type: Tuohy  Needle gauge: 17 G Needle length: 9 cm and 9 Needle insertion depth: 7 cm Catheter type: closed end flexible Catheter size: 19 Gauge Catheter at skin depth: 12 cm Test dose: negative  Assessment Events: blood not aspirated, injection not painful, no injection resistance, no paresthesia and negative IV test

## 2020-10-19 MED ORDER — FERROUS SULFATE 325 (65 FE) MG PO TABS
325.0000 mg | ORAL_TABLET | ORAL | Status: DC
  Administered 2020-10-19: 325 mg via ORAL
  Filled 2020-10-19: qty 1

## 2020-10-19 MED ORDER — OXYCODONE HCL 5 MG PO TABS
5.0000 mg | ORAL_TABLET | Freq: Once | ORAL | Status: AC
  Administered 2020-10-19: 5 mg via ORAL
  Filled 2020-10-19: qty 1

## 2020-10-19 MED ORDER — ASCORBIC ACID 500 MG PO TABS
250.0000 mg | ORAL_TABLET | ORAL | Status: DC
  Administered 2020-10-19: 250 mg via ORAL
  Filled 2020-10-19: qty 1

## 2020-10-19 NOTE — Lactation Note (Signed)
This note was copied from a baby's chart. Lactation Consultation Note Went to see mom. Mom has changed her mind a couple of times about needing or wanting to see Lactation. LC went to see mom. Mom was awake. Asked mom if she had any questions. Mom stated baby wasn't interested in feeding on the breast or on the bottle. Newborn feeding habits and behavior discussed. Encouraged mom to call for Lactation if would like them to see mom. Left Lactation brochure w/mom.  Patient Name: Laura Roy [id number] Date: 10/19/2020   Age:84 hours  Maternal Data    Feeding Nipple Type: Slow - flow  LATCH Score                    Lactation Tools Discussed/Used    Interventions    Discharge    Consult Status      Charyl Dancer 10/19/2020, 5:46 AM

## 2020-10-19 NOTE — Lactation Note (Signed)
This note was copied from a baby's chart. Lactation Consultation Note  Patient Name: Laura Roy [id number] Date: 10/19/2020   Age:27 hours  Per RN, mother declined lactation services.   Maternal Data    Feeding Nipple Type: Slow - flow  LATCH Score                    Lactation Tools Discussed/Used    Interventions    Discharge    Consult Status      Laura Roy 10/19/2020, 6:27 PM

## 2020-10-19 NOTE — Progress Notes (Signed)
Post Partum Day #1 Subjective: no complaints, up ad lib and tolerating PO; baby hasn't latched yet and isn't interested in the bottle either; unsure on contraception  Objective: Blood pressure 109/77, pulse 72, temperature 98.5 F (36.9 C), temperature source Oral, resp. rate 18, height 5\' 5"  (1.651 m), weight 99.4 kg, last menstrual period 12/19/2019, SpO2 99 %, unknown if currently breastfeeding.  Physical Exam:  General: alert, cooperative and no distress Lochia: appropriate Uterine Fundus: firm DVT Evaluation: No evidence of DVT seen on physical exam.  Recent Labs    10/18/20 0104  HGB 9.6*  HCT 31.4*    Assessment/Plan: Plan for discharge tomorrow  LC today   LOS: 1 day   12/18/20 CNM 10/19/2020, 8:19 AM

## 2020-10-19 NOTE — Anesthesia Postprocedure Evaluation (Signed)
Anesthesia Post Note  Patient: Laura Roy  Procedure(s) Performed: AN AD HOC LABOR EPIDURAL     Patient location during evaluation: Mother Baby Anesthesia Type: Epidural Level of consciousness: awake and alert Pain management: pain level controlled Vital Signs Assessment: post-procedure vital signs reviewed and stable Respiratory status: spontaneous breathing, nonlabored ventilation and respiratory function stable Cardiovascular status: stable Postop Assessment: no headache, no backache and epidural receding Anesthetic complications: no   No complications documented.  Last Vitals:  Vitals:   10/19/20 0005 10/19/20 0500  BP: 125/76 109/77  Pulse: 89 72  Resp: 18 18  Temp: 36.7 C 36.9 C  SpO2: 99% 99%    Last Pain:  Vitals:   10/19/20 0500  TempSrc: Oral  PainSc: 2    Pain Goal:                   Laura Roy

## 2020-10-19 NOTE — Progress Notes (Signed)
Rn received verbal order from Mart Piggs for one time dose of oxycodone 5mg  for patient pain

## 2020-10-19 NOTE — Progress Notes (Signed)
Patient  States that the iv was hurting and burning and requested that it be removed. Informed patient that she may have to be be re stuck .

## 2020-10-20 MED ORDER — ASCORBIC ACID 250 MG PO TABS: 250.0000 mg | ORAL_TABLET | ORAL | Status: AC

## 2020-10-20 MED ORDER — COCONUT OIL OIL: 1.0000 "application " | TOPICAL_OIL | 0 refills | Status: AC | PRN

## 2020-10-20 MED ORDER — IBUPROFEN 600 MG PO TABS: 600.0000 mg | ORAL_TABLET | Freq: Four times a day (QID) | ORAL | 0 refills | Status: DC

## 2020-10-20 MED ORDER — FERROUS SULFATE 324 (65 FE) MG PO TBEC: 1.0000 | DELAYED_RELEASE_TABLET | ORAL | 1 refills | Status: AC

## 2020-10-20 MED ORDER — IBUPROFEN 600 MG PO TABS: 600.0000 mg | ORAL_TABLET | Freq: Four times a day (QID) | ORAL | 0 refills | Status: AC | PRN

## 2020-10-20 MED ORDER — ACETAMINOPHEN 500 MG PO TABS: 1000.0000 mg | ORAL_TABLET | Freq: Four times a day (QID) | ORAL | Status: AC | PRN

## 2020-10-20 NOTE — Discharge Instructions (Signed)
-take tylenol 1000 mg every 6 hours as needed for pain, alternate with ibuprofen 600 mg every 6 hours -drink plenty of water to help with breastfeeding -continue prenatal vitamins while you are breastfeeding -take iron pills every other day with vitamin c, this will help healing as well as breast feeding -think about birth control options-->bedisider.org is a great website! You can get any form of birth control from the health department for free   Postpartum Care After Vaginal Delivery The following information offers guidance about how to care for yourself from the time you deliver your baby to 6-12 weeks after delivery (postpartum period). If you have problems or questions, contact your health care provider for more specific instructions. Follow these instructions at home: Vaginal bleeding  It is normal to have vaginal bleeding (lochia) after delivery. Wear a sanitary pad for bleeding and discharge. ? During the first week after delivery, the amount and appearance of lochia is often similar to a menstrual period. ? Over the next few weeks, it will gradually decrease to a dry, yellow-brown discharge. ? For most women, lochia stops completely by 4-6 weeks after delivery, but can vary.  Change your sanitary pads frequently. Watch for any changes in your flow, such as: ? A sudden increase in volume. ? A change in color. ? Large blood clots.  If you pass a blood clot from your vagina, save it and call your health care provider. Do not flush blood clots down the toilet before talking with your health care provider.  Do not use tampons or douches until your health care provider approves.  If you are not breastfeeding, your period should return 6-8 weeks after delivery. If you are feeding your baby breast milk only, your period may not return until you stop breastfeeding. Perineal care  Keep the area between the vagina and the anus (perineum) clean and dry. Use medicated pads and  pain-relieving sprays and creams as directed.  If you had a surgical cut in the perineum (episiotomy) or a tear, check the area for signs of infection until you are healed. Check for: ? More redness, swelling, or pain. ? Fluid or blood coming from the cut or tear. ? Warmth. ? Pus or a bad smell.  You may be given a squirt bottle to use instead of wiping to clean the perineum area after you use the bathroom. Pat the area gently to dry it.  To relieve pain caused by an episiotomy, a tear, or swollen veins in the anus (hemorrhoids), take a warm sitz bath 2-3 times a day. In a sitz bath, the warm water should only come up to your hips and cover your buttocks.   Breast care  In the first few days after delivery, your breasts may feel heavy, full, and uncomfortable (breast engorgement). Milk may also leak from your breasts. Ask your health care provider about ways to help relieve the discomfort.  If you are breastfeeding: ? Wear a bra that supports your breasts and fits well. Use breast pads to absorb milk that leaks. ? Keep your nipples clean and dry. Apply creams and ointments as told. ? You may have uterine contractions every time you breastfeed for up to several weeks after delivery. This helps your uterus return to its normal size. ? If you have any problems with breastfeeding, notify your health care provider or lactation consultant.  If you are not breastfeeding: ? Avoid touching your breasts. Do not squeeze out (express) milk. Doing this can make your   breasts produce more milk. ? Wear a good-fitting bra and use cold packs to help with swelling. Intimacy and sexuality  Ask your health care provider when you can engage in sexual activity. This may depend upon: ? Your risk of infection. ? How fast you are healing. ? Your comfort and desire to engage in sexual activity.  You are able to get pregnant after delivery, even if you have not had your period. Talk with your health care provider  about methods of birth control (contraception) or family planning if you desire future pregnancies. Medicines  Take over-the-counter and prescription medicines only as told by your health care provider.  Take an over-the-counter stool softener to help ease bowel movements as told by your health care provider.  If you were prescribed an antibiotic medicine, take it as told by your health care provider. Do not stop taking the antibiotic even if you start to feel better.  Review all previous and current prescriptions to check for possible transfer into breast milk. Activity  Gradually return to your normal activities as told by your health care provider.  Rest as much as possible. Nap while your baby is sleeping. Eating and drinking  Drink enough fluid to keep your urine pale yellow.  To help prevent or relieve constipation, eat high-fiber foods every day.  Choose healthy eating to support breastfeeding or weight loss goals.  Take your prenatal vitamins until your health care provider tells you to stop.   General tips/recommendations  Do not use any products that contain nicotine or tobacco. These products include cigarettes, chewing tobacco, and vaping devices, such as e-cigarettes. If you need help quitting, ask your health care provider.  Do not drink alcohol, especially if you are breastfeeding.  Do not take medications or drugs that are not prescribed to you, especially if you are breastfeeding.  Visit your health care provider for a postpartum checkup within the first 3-6 weeks after delivery.  Complete a comprehensive postpartum visit no later than 12 weeks after delivery.  Keep all follow-up visits for you and your baby. Contact a health care provider if:  You feel unusually sad or worried.  Your breasts become red, painful, or hard.  You have a fever or other signs of an infection.  You have bleeding that is soaking through one pad an hour or you have blood  clots.  You have a severe headache that doesn't go away or you have vision changes.  You have nausea and vomiting and are unable to eat or drink anything for 24 hours. Get help right away if:  You have chest pain or difficulty breathing.  You have sudden, severe leg pain.  You faint or have a seizure.  You have thoughts about hurting yourself or your baby. If you ever feel like you may hurt yourself or others, or have thoughts about taking your own life, get help right away. Go to your nearest emergency department or:  Call your local emergency services (911 in the [place]  The National Suicide Prevention Lifeline at 1-800-273-8255. This suicide crisis helpline is open 24 hours a day.  Text the Crisis Text Line at 741741 (in the [place] Summary  The period of time after you deliver your newborn up to 6-12 weeks after delivery is called the postpartum period.  Keep all follow-up visits for you and your baby.  Review all previous and current prescriptions to check for possible transfer into breast milk.  Contact a health care provider if you feel   unusually sad or worried during the postpartum period. This information is not intended to replace advice given to you by your health care provider. Make sure you discuss any questions you have with your health care provider. Document Revised: 02/15/2020 Document Reviewed: 02/15/2020 Elsevier Patient Education  2021 Elsevier Inc.  

## 2020-11-15 ENCOUNTER — Ambulatory Visit (INDEPENDENT_AMBULATORY_CARE_PROVIDER_SITE_OTHER): Payer: BC Managed Care – PPO | Admitting: Obstetrics and Gynecology

## 2020-11-15 ENCOUNTER — Other Ambulatory Visit: Payer: Self-pay

## 2020-11-15 ENCOUNTER — Encounter: Payer: Self-pay | Admitting: Obstetrics and Gynecology

## 2020-11-15 NOTE — Progress Notes (Signed)
Post Partum Visit Note  Laura Roy is a 25 y.o. G32P2002 female who presents for a postpartum visit. She is one month  postpartum following a vaginal delivery.  I have fully reviewed the prenatal and intrapartum course. The delivery was at 40.6 gestational weeks.  Anesthesia: Epidural. Postpartum course has been uncomplicated. Baby is doing well. Baby is feeding by bottle. Bleeding yes. Bowel function is normal. Bladder function is normal. Patient is not sexually active. Contraception method is nothing at this time. Postpartum depression screening: negative   The pregnancy intention screening data noted above was reviewed. Potential methods of contraception were discussed. The patient elected to proceed with No Method - Other Reason.    Edinburgh Postnatal Depression Scale - 11/15/20 0843      Edinburgh Postnatal Depression Scale:  In the Past 7 Days   I have been able to laugh and see the funny side of things. 0    I have looked forward with enjoyment to things. 0    I have blamed myself unnecessarily when things went wrong. 0    I have been anxious or worried for no good reason. 0    I have felt scared or panicky for no good reason. 0    Things have been getting on top of me. 0    I have been so unhappy that I have had difficulty sleeping. 0    I have felt sad or miserable. 0    I have been so unhappy that I have been crying. 0    The thought of harming myself has occurred to me. 0    Edinburgh Postnatal Depression Scale Total 0           Health Maintenance Due  Topic Date Due  . COVID-19 Vaccine (1) Never done    The following portions of the patient's history were reviewed and updated as appropriate: allergies, current medications, past family history, past medical history, past social history, past surgical history and problem list.  Review of Systems Pertinent items are noted in HPI.  Objective:  BP (!) 132/94   Pulse 77   Wt 200 lb 9.6 oz (91 kg)   BMI 33.38  kg/m    General:  alert and cooperative  Lungs: clear to auscultation bilaterally  Heart:  regular rate and rhythm, S1, S2 normal, no murmur, click, rub or gallop  Abdomen: soft, non-tender; bowel sounds normal; no masses,  no organomegaly        Assessment:   -Normal postpartum exam.  - initial BP mildly elevated, recheck:  - Declines BC at this time - Pap up to date; normal in 03/2020  Plan:   Essential components of care per ACOG recommendations:  1.  Mood and well being: Patient with negative depression screening today. Reviewed local resources for support.  - Patient tobacco use? No.   - hx of drug use? No.    2. Infant care and feeding:  -Patient currently breastmilk feeding? No.  -Social determinants of health (SDOH) reviewed in EPIC. No concerns  3. Sexuality, contraception and birth spacing - Patient does not want a pregnancy in the next year.  Desired family size is 3 children.  - Reviewed forms of contraception in tiered fashion. Patient desired no method today.   - Discussed birth spacing of 18 months  4. Sleep and fatigue -Encouraged family/partner/community support of 4 hrs of uninterrupted sleep to help with mood and fatigue  5. Physical Recovery  - Discussed patients  delivery and complications. She describes her labor as good. - Patient had a Vaginal, no problems at delivery. Patient had a No laceration. Perineal healing reviewed. Patient expressed understanding - Patient has urinary incontinence? No. - Patient is safe to resume physical and sexual activity  6.  Health Maintenance - HM due items addressed Yes - Last pap smear No results found for: DIAGPAP Pap smear not done at today's visit.  Last pap 03/2020 -Breast Cancer screening indicated? No.   7. Chronic Disease/Pregnancy Condition follow up: None  - PCP follow up  Venia Carbon, NP [facility] [employer], [facility]

## 2020-12-10 ENCOUNTER — Encounter (HOSPITAL_COMMUNITY): Payer: Self-pay

## 2020-12-10 ENCOUNTER — Other Ambulatory Visit: Payer: Self-pay

## 2020-12-10 ENCOUNTER — Emergency Department (HOSPITAL_COMMUNITY)
Admission: EM | Admit: 2020-12-10 | Discharge: 2020-12-10 | Disposition: A | Discharge status: Home / Self Care | Payer: BC Managed Care – PPO | Attending: Emergency Medicine | Admitting: Emergency Medicine

## 2020-12-10 DIAGNOSIS — R21 Rash and other nonspecific skin eruption: Secondary | ICD-10-CM | POA: Diagnosis present

## 2020-12-10 DIAGNOSIS — B084 Enteroviral vesicular stomatitis with exanthem: Secondary | ICD-10-CM

## 2020-12-10 NOTE — Discharge Instructions (Addendum)
You were evaluated in the Emergency Department and after careful evaluation, we did not find any emergent condition requiring admission or further testing in the hospital.  As discussed, I suspect this is hand foot and mouth disease. This is likely viral and should resolve on its own.   Please return to the Emergency Department if you experience any worsening of your condition.  We encourage you to follow up with a primary care provider.  Thank you for allowing [place] to be a part of your care.

## 2020-12-10 NOTE — ED Triage Notes (Signed)
Patient arrived with complaints of itchy bumps on her hands and her tongue since Saturday.

## 2020-12-10 NOTE — ED Provider Notes (Signed)
[facility]-EMERGENCY DEPT Provider Note   CSN: [id number] Arrival date & time: 12/10/20  1841     History Chief Complaint  Patient presents with   Rash    Laura Roy is a 25 y.o. female.  HPI 25 year old female presented to the ER with complaints of rash to bilateral hands, lesions on her tongue.  Rash developed on Saturday.  Triage does note itchy but she states they are minimally itchy.  She also noticed some lesions on her tongue.  She denies any difficulty swallowing or breathing. No tick exposures. She is sexually active with one partner, no concerns for sexually transmitted disease. No difficulty breathing or swallowing.  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   Abnormal fetal ultrasound 09/18/2020   Supervision of normal pregnancy, antepartum 09/05/2020   GBS bacteriuria 09/05/2020    Past Surgical History:  Procedure Laterality Date   NO PAST SURGERIES       OB History     Gravida  3   Para  2   Term  2   Preterm      AB      Living  2      SAB      IAB      Ectopic      Multiple  0   Live Births  2           Family History  Problem Relation Age of Onset   Kidney Stones Mother     Social History   Tobacco Use   Smoking status: Never   Smokeless tobacco: Never  Vaping Use   Vaping Use: Never used  Substance Use Topics   Alcohol use: Not Currently   Drug use: Never    Home Medications Prior to Admission medications   Medication Sig Start Date End Date Taking? Authorizing Provider  acetaminophen (TYLENOL) 500 MG tablet Take 2 tablets (1,000 mg total) by mouth every 6 (six) hours as needed for mild pain, fever or headache. Patient  not taking: Reported on 11/15/2020 10/20/20   Alric Seton, MD  ascorbic acid (VITAMIN C) 250 MG tablet Take 1 tablet (250 mg total) by mouth every other day. Patient not taking: Reported on 11/15/2020 10/21/20   Alric Seton, MD  coconut oil OIL Apply 1 application topically as needed. Patient not taking: Reported on 11/15/2020 10/20/20   Jovita Kussmaul, MD  famotidine (PEPCID) 20 MG tablet Take 20 mg by mouth 2 (two) times daily as needed for heartburn or indigestion. Patient not taking: Reported on 11/15/2020    [provider]  ferrous sulfate 324 (65 Fe) MG TBEC Take 1 tablet (325 mg total) by mouth every other day. Patient not taking: Reported on 11/15/2020 10/20/20   Alric Seton, MD  ibuprofen (ADVIL) 600 MG tablet Take 1 tablet (600 mg total) by mouth every 6 (six) hours as needed. Patient not taking: Reported on 11/15/2020 10/20/20   Alric Seton, MD    Allergies    Patient has no known allergies.  Review of Systems   Review of Systems  Constitutional:  Negative for chills and fever.  Genitourinary:  Negative for genital sores.  Skin:  Positive for rash.   Physical Exam Updated Vital Signs BP 127/84 (BP Location: Right Arm)   Pulse 84   Temp 98.7 F (37.1 C) (Oral)   Resp 18   Ht 5\' 5"  (1.651 m)   Wt 93 kg   SpO2 100%   BMI 34.11 kg/m   Physical Exam Vitals and nursing note reviewed.  Constitutional:      General: She is not in acute distress.    Appearance: She is well-developed.  HENT:     Head: Normocephalic and atraumatic.     Mouth/Throat:     Comments: Oropharynx non erythematous without exudates, uvula midline, no unilateral tonsillar swelling, tongue normal size and midline, no sublingual/submandibular swellimg, tolerating secretions well     Eyes:     Conjunctiva/sclera: Conjunctivae normal.  Cardiovascular:     Rate and Rhythm: Normal rate and regular rhythm.     Heart sounds: No murmur heard. Pulmonary:     Effort: Pulmonary  effort is normal. No respiratory distress.     Breath sounds: Normal breath sounds.  Abdominal:     Palpations: Abdomen is soft.     Tenderness: There is no abdominal tenderness.  Musculoskeletal:     Cervical back: Neck supple.  Skin:    General: Skin is warm and dry.     Findings: Rash present.     Comments: Scattered raised papules on hand, with circular lesions on the tongue (geographic tongue?).  1 small raised papule on the right foot.  Neurological:     Mental Status: She is alert.       ED Results / Procedures / Treatments   Labs (all labs ordered are listed, but only abnormal results are displayed) Labs Reviewed - No data to display  EKG None  Radiology No results found.  Procedures Procedures   Medications Ordered in ED Medications - No data to display  ED Course  I have reviewed the triage vital signs and the nursing notes.  Pertinent labs & imaging results that were available during my care of the patient were reviewed by me and considered in my medical decision making (see chart for details).    MDM Rules/Calculators/A&P                         25 year old female with rash of the ventral aspect of both hands, tongue, 1 spot on the foot.  On arrival, she is afebrile, well-appearing, no acute distress, resting comfortably in ER bed.  Pharynx patent, uvula midline, no significant erythema.  Patient denies any known tick exposures.  She is sexually active with 1 partner and is monogamous, low suspicion for syphilis.  Do not suspect tickborne illness at this time.  Suspect this may be hand-foot-and-mouth disease.  Discussed with Dr. 34 who agrees.  Patient was reassured, encouraged that this should resolve on its own.  We discussed return precautions.  Encourage PCP follow-up.  She was understanding and is agreeable.  Stable for discharge.  Final Clinical Impression(s) / ED Diagnoses Final diagnoses:  Hand, foot and mouth disease    Rx / DC Orders ED  Discharge Orders     None        Rodena Medin 12/10/20 2007    2008, MD 12/11/20 1451

## 2020-12-15 ENCOUNTER — Emergency Department (HOSPITAL_COMMUNITY)
Admission: EM | Admit: 2020-12-15 | Discharge: 2020-12-15 | Disposition: A | Discharge status: Home / Self Care | Payer: BC Managed Care – PPO | Attending: Emergency Medicine | Admitting: Emergency Medicine

## 2020-12-15 ENCOUNTER — Encounter (HOSPITAL_COMMUNITY): Payer: Self-pay

## 2020-12-15 ENCOUNTER — Other Ambulatory Visit: Payer: Self-pay

## 2020-12-15 DIAGNOSIS — B084 Enteroviral vesicular stomatitis with exanthem: Secondary | ICD-10-CM | POA: Insufficient documentation

## 2020-12-15 DIAGNOSIS — R21 Rash and other nonspecific skin eruption: Secondary | ICD-10-CM | POA: Diagnosis present

## 2020-12-15 MED ORDER — HYDROXYZINE HCL 25 MG PO TABS: 25.0000 mg | ORAL_TABLET | Freq: Four times a day (QID) | ORAL | 0 refills | Status: AC

## 2020-12-15 NOTE — ED Provider Notes (Signed)
[facility]-EMERGENCY DEPT Provider Note   CSN: [id number] Arrival date & time: 12/15/20  1141     History Chief Complaint  Patient presents with   Rash    Hand, foot, mouth    Laura Roy is a 25 y.o. female.  Laura Roy is a 25 y.o. female who is otherwise healthy returns for reevaluation after she was diagnosed with hand-foot-and-mouth on 6/28.  At that time she had symptoms for about 2 days and had rash present on Bilateral hands and 1 foot as well as lesions on the tongue and posterior oropharynx.  She reports since then she has had worsening of the rash on her hands and feet and it has become more pruritic.  She still has lesions in her tongue and throat and reports it feels a bit odd when she eats but she has not had difficulty swallowing or eating.  Patient reports she just came to get it rechecked because she was concerned that it had spread and was not improving at all.  No other new or worsening symptoms.  The history is provided by the patient.      Past Medical History:  Diagnosis Date   Medical history non-contributory     Patient Active Problem List   Diagnosis Date Noted   Vaginal delivery 10/20/2020   Post-dates pregnancy 10/18/2020   Anemia in pregnancy 09/21/2020   LGSIL on Pap smear of cervix 09/18/2020   Abnormal fetal ultrasound 09/18/2020   Supervision of normal pregnancy, antepartum 09/05/2020   GBS bacteriuria 09/05/2020    Past Surgical History:  Procedure Laterality Date   NO PAST SURGERIES       OB History     Gravida  3   Para  2   Term  2   Preterm      AB      Living  2      SAB      IAB      Ectopic      Multiple  0   Live Births  2           Family History  Problem Relation Age of Onset   Kidney Stones Mother     Social History   Tobacco Use   Smoking status: Never   Smokeless tobacco: Never  Vaping Use   Vaping Use: Never used  Substance Use Topics   Alcohol use: Not  Currently   Drug use: Never    Home Medications Prior to Admission medications   Medication Sig Start Date End Date Taking? Authorizing Provider  hydrOXYzine (ATARAX/VISTARIL) 25 MG tablet Take 1 tablet (25 mg total) by mouth every 6 (six) hours. 12/15/20  Yes Dartha Lodge, PA-C  acetaminophen (TYLENOL) 500 MG tablet Take 2 tablets (1,000 mg total) by mouth every 6 (six) hours as needed for mild pain, fever or headache. Patient not taking: Reported on 11/15/2020 10/20/20   Alric Seton, MD  ascorbic acid (VITAMIN C) 250 MG tablet Take 1 tablet (250 mg total) by mouth every other day. Patient not taking: Reported on 11/15/2020 10/21/20   Alric Seton, MD  coconut oil OIL Apply 1 application topically as needed. Patient not taking: Reported on 11/15/2020 10/20/20   Jovita Kussmaul, MD  famotidine (PEPCID) 20 MG tablet Take 20 mg by mouth 2 (two) times daily as needed for heartburn or indigestion. Patient not taking: Reported on 11/15/2020    [provider]  ferrous sulfate 324 (65 Fe)  MG TBEC Take 1 tablet (325 mg total) by mouth every other day. Patient not taking: Reported on 11/15/2020 10/20/20   Alric Seton, MD  ibuprofen (ADVIL) 600 MG tablet Take 1 tablet (600 mg total) by mouth every 6 (six) hours as needed. Patient not taking: Reported on 11/15/2020 10/20/20   Alric Seton, MD    Allergies    Patient has no known allergies.  Review of Systems   Review of Systems  Constitutional:  Negative for chills and fever.  HENT:  Negative for sore throat and trouble swallowing.   Skin:  Positive for rash.  All other systems reviewed and are negative.  Physical Exam Updated Vital Signs BP 114/88   Pulse 88   Temp 98.4 F (36.9 C) (Oral)   Resp 18   SpO2 98%   Physical Exam Vitals and nursing note reviewed.  Constitutional:      General: She is not in acute distress.    Appearance: Normal appearance. She is well-developed and normal weight. She is not  ill-appearing or diaphoretic.  HENT:     Head: Normocephalic and atraumatic.     Mouth/Throat:     Comments: Several raised and erythematous papules noted to the tongue and posterior oropharynx, no swelling, tolerating secretions, normal phonation Eyes:     General:        Right eye: No discharge.        Left eye: No discharge.  Pulmonary:     Effort: Pulmonary effort is normal. No respiratory distress.  Skin:    General: Skin is warm and dry.     Findings: Rash present.     Comments: Many red papular lesions noted over the palms and soles of the feet, none noted elsewhere, pruritic, no pustules, vesicles or petechiae  Neurological:     Mental Status: She is alert and oriented to person, place, and time.     Coordination: Coordination normal.  Psychiatric:        Mood and Affect: Mood normal.        Behavior: Behavior normal.    ED Results / Procedures / Treatments   Labs (all labs ordered are listed, but only abnormal results are displayed) Labs Reviewed - No data to display  EKG None  Radiology No results found.  Procedures Procedures   Medications Ordered in ED Medications - No data to display  ED Course  I have reviewed the triage vital signs and the nursing notes.  Pertinent labs & imaging results that were available during my care of the patient were reviewed by me and considered in my medical decision making (see chart for details).    MDM Rules/Calculators/A&P                         Patient returns for reevaluation, has red papular rash to the tongue, posterior oropharynx, hands and feet, this presentation is still consistent with hand-foot-and-mouth, she is only on day 7 of symptoms, discussed that this typically last 7 to 10 days before you start to see improvement in the rash.  She is having some increased itching, will prescribe hydroxyzine, no difficulty swallowing, eating or drinking.  Encouraged her to use ibuprofen and Tylenol as needed for discomfort  and cefaclor throat lozenges as needed.  Provided reassurance.  Discharged home in good condition.  Final Clinical Impression(s) / ED Diagnoses Final diagnoses:  Hand, foot and mouth disease (HFMD)    Rx / DC Orders  ED Discharge Orders          Ordered    hydrOXYzine (ATARAX/VISTARIL) 25 MG tablet  Every 6 hours        12/15/20 1413             Dartha Lodge, [place] 12/15/20 1844    Pricilla Loveless, MD 12/15/20 2232

## 2020-12-15 NOTE — Discharge Instructions (Addendum)
The hand-foot-and-mouth rash usually last a minimum of 7 to 10 days, it is not uncommon for it to get a bit worse before it gets better.  You can use prescribed hydroxyzine as needed for itching.  Ibuprofen and Tylenol as needed for pain and if you have worsening sore throat you can use topical throat lozenges.  Follow-up with your PCP if symptoms or not resolved in a week.

## 2020-12-15 NOTE — ED Triage Notes (Signed)
Pt states she was dx with hand, foot, and mouth a few days ago. Pt states that is is now getting worse.

## 2021-01-15 ENCOUNTER — Other Ambulatory Visit: Payer: Self-pay

## 2021-01-15 ENCOUNTER — Encounter (HOSPITAL_COMMUNITY): Payer: Self-pay

## 2021-01-15 ENCOUNTER — Emergency Department (HOSPITAL_COMMUNITY)
Admission: EM | Admit: 2021-01-15 | Discharge: 2021-01-15 | Disposition: A | Discharge status: Home / Self Care | Payer: Medicaid Other | Attending: Emergency Medicine | Admitting: Emergency Medicine

## 2021-01-15 ENCOUNTER — Emergency Department (HOSPITAL_COMMUNITY): Payer: Medicaid Other

## 2021-01-15 DIAGNOSIS — J029 Acute pharyngitis, unspecified: Secondary | ICD-10-CM

## 2021-01-15 DIAGNOSIS — Z20822 Contact with and (suspected) exposure to covid-19: Secondary | ICD-10-CM | POA: Insufficient documentation

## 2021-01-15 DIAGNOSIS — R6883 Chills (without fever): Secondary | ICD-10-CM | POA: Diagnosis not present

## 2021-01-15 DIAGNOSIS — N9489 Other specified conditions associated with female genital organs and menstrual cycle: Secondary | ICD-10-CM | POA: Insufficient documentation

## 2021-01-15 DIAGNOSIS — R519 Headache, unspecified: Secondary | ICD-10-CM | POA: Diagnosis not present

## 2021-01-15 LAB — CBC WITH DIFFERENTIAL/PLATELET
Abs Immature Granulocytes: 0.03 10*3/uL (ref 0.00–0.07)
Basophils Absolute: 0 10*3/uL (ref 0.0–0.1)
Basophils Relative: 0 %
Eosinophils Absolute: 0 10*3/uL (ref 0.0–0.5)
Eosinophils Relative: 0 %
HCT: 38.1 % (ref 36.0–46.0)
Hemoglobin: 11.9 g/dL — ABNORMAL LOW (ref 12.0–15.0)
Immature Granulocytes: 0 %
Lymphocytes Relative: 13 %
Lymphs Abs: 1.3 10*3/uL (ref 0.7–4.0)
MCH: 25.4 pg — ABNORMAL LOW (ref 26.0–34.0)
MCHC: 31.2 g/dL (ref 30.0–36.0)
MCV: 81.4 fL (ref 80.0–100.0)
Monocytes Absolute: 0.7 10*3/uL (ref 0.1–1.0)
Monocytes Relative: 7 %
Neutro Abs: 8 10*3/uL — ABNORMAL HIGH (ref 1.7–7.7)
Neutrophils Relative %: 80 %
Platelets: 322 10*3/uL (ref 150–400)
RBC: 4.68 MIL/uL (ref 3.87–5.11)
RDW: 15.3 % (ref 11.5–15.5)
WBC: 10 10*3/uL (ref 4.0–10.5)
nRBC: 0 % (ref 0.0–0.2)

## 2021-01-15 LAB — RESP PANEL BY RT-PCR (FLU A&B, COVID) ARPGX2
Influenza A by PCR: NEGATIVE
Influenza B by PCR: NEGATIVE
SARS Coronavirus 2 by RT PCR: NEGATIVE

## 2021-01-15 LAB — GROUP A STREP BY PCR: Group A Strep by PCR: NOT DETECTED

## 2021-01-15 LAB — COMPREHENSIVE METABOLIC PANEL
ALT: 29 U/L (ref 0–44)
AST: 20 U/L (ref 15–41)
Albumin: 4.3 g/dL (ref 3.5–5.0)
Alkaline Phosphatase: 54 U/L (ref 38–126)
Anion gap: 12 (ref 5–15)
BUN: 9 mg/dL (ref 6–20)
CO2: 21 mmol/L — ABNORMAL LOW (ref 22–32)
Calcium: 8.7 mg/dL — ABNORMAL LOW (ref 8.9–10.3)
Chloride: 100 mmol/L (ref 98–111)
Creatinine, Ser: 0.59 mg/dL (ref 0.44–1.00)
GFR, Estimated: >60 mL/min (ref >60)
Glucose, Bld: 90 mg/dL (ref 70–99)
Potassium: 3.5 mmol/L (ref 3.5–5.1)
Sodium: 133 mmol/L — ABNORMAL LOW (ref 135–145)
Total Bilirubin: 0.8 mg/dL (ref 0.3–1.2)
Total Protein: 8.3 g/dL — ABNORMAL HIGH (ref 6.5–8.1)

## 2021-01-15 LAB — MONONUCLEOSIS SCREEN: Mono Screen: NEGATIVE

## 2021-01-15 LAB — HCG, SERUM, QUALITATIVE: Preg, Serum: NEGATIVE

## 2021-01-15 LAB — I-STAT BETA HCG BLOOD, ED (MC, WL, AP ONLY): I-stat hCG, quantitative: 21 m[IU]/mL — ABNORMAL HIGH (ref <5)

## 2021-01-15 MED ORDER — IOHEXOL 350 MG/ML SOLN
75.0000 mL | Freq: Once | INTRAVENOUS | Status: AC | PRN
  Administered 2021-01-15: 60 mL via INTRAVENOUS

## 2021-01-15 MED ORDER — LACTATED RINGERS IV BOLUS
1000.0000 mL | Freq: Once | INTRAVENOUS | Status: AC
  Administered 2021-01-15: 1000 mL via INTRAVENOUS

## 2021-01-15 MED ORDER — IBUPROFEN 800 MG PO TABS
800.0000 mg | ORAL_TABLET | Freq: Once | ORAL | Status: AC
  Administered 2021-01-15: 800 mg via ORAL
  Filled 2021-01-15: qty 1

## 2021-01-15 MED ORDER — ACETAMINOPHEN 325 MG PO TABS
650.0000 mg | ORAL_TABLET | Freq: Once | ORAL | Status: AC | PRN
  Administered 2021-01-15: 650 mg via ORAL
  Filled 2021-01-15: qty 2

## 2021-01-15 MED ORDER — ACETAMINOPHEN 500 MG PO TABS: 1000.0000 mg | ORAL_TABLET | Freq: Once | ORAL | Status: DC

## 2021-01-15 MED ORDER — AMOXICILLIN-POT CLAVULANATE 875-125 MG PO TABS: 1.0000 | ORAL_TABLET | Freq: Two times a day (BID) | ORAL | 0 refills | Status: AC

## 2021-01-15 NOTE — ED Provider Notes (Signed)
[facility]-EMERGENCY DEPT Provider Note   CSN: [id number] Arrival date & time: 01/15/21  1145     History Chief Complaint  Patient presents with   Sore Throat    Laura Roy is a 25 y.o. female.  The history is provided by the patient.  Sore Throat This is a new problem. The current episode started 2 days ago. The problem occurs constantly. The problem has been gradually worsening. Associated symptoms include headaches. Pertinent negatives include no chest pain, no abdominal pain and no shortness of breath. The symptoms are aggravated by eating and drinking. Nothing relieves the symptoms. She has tried acetaminophen for the symptoms.   34-year-old female with medical history of below presenting to the emergency department with sore throat, chills, headache that began 2 days ago.  She endorses bilateral sore throat with a sensation of swelling on the right side of her throat that has been worsening over the past few days.  She denies any cough or nasal congestion.  She denies any sick contacts or COVID-19 exposures.  Past Medical History:  Diagnosis Date   Medical history non-contributory     Patient Active Problem List   Diagnosis Date Noted   Vaginal delivery 10/20/2020   Post-dates pregnancy 10/18/2020   Anemia in pregnancy 09/21/2020   LGSIL on Pap smear of cervix 09/18/2020   Abnormal fetal ultrasound 09/18/2020   Supervision of normal pregnancy, antepartum 09/05/2020   GBS bacteriuria 09/05/2020    Past Surgical History:  Procedure Laterality Date   NO PAST SURGERIES       OB History     Gravida  3   Para  2   Term  2   Preterm      AB      Living  2      SAB      IAB      Ectopic      Multiple  0   Live Births  2           Family History  Problem Relation Age of Onset   Kidney Stones Mother     Social History   Tobacco Use   Smoking status: Never   Smokeless tobacco: Never  Vaping Use   Vaping Use: Never  used  Substance Use Topics   Alcohol use: Not Currently   Drug use: Never    Home Medications Prior to Admission medications   Medication Sig Start Date End Date Taking? Authorizing Provider  amoxicillin-clavulanate (AUGMENTIN) 875-125 MG tablet Take 1 tablet by mouth every 12 (twelve) hours. 01/15/21  Yes Ernie Avena, MD  acetaminophen (TYLENOL) 500 MG tablet Take 2 tablets (1,000 mg total) by mouth every 6 (six) hours as needed for mild pain, fever or headache. Patient not taking: Reported on 11/15/2020 10/20/20   Alric Seton, MD  ascorbic acid (VITAMIN C) 250 MG tablet Take 1 tablet (250 mg total) by mouth every other day. Patient not taking: Reported on 11/15/2020 10/21/20   Alric Seton, MD  coconut oil OIL Apply 1 application topically as needed. Patient not taking: Reported on 11/15/2020 10/20/20   Jovita Kussmaul, MD  famotidine (PEPCID) 20 MG tablet Take 20 mg by mouth 2 (two) times daily as needed for heartburn or indigestion. Patient not taking: Reported on 11/15/2020    [provider]  ferrous sulfate 324 (65 Fe) MG TBEC Take 1 tablet (325 mg total) by mouth every other day. Patient not taking: Reported on 11/15/2020 10/20/20  Alric Seton, MD  hydrOXYzine (ATARAX/VISTARIL) 25 MG tablet Take 1 tablet (25 mg total) by mouth every 6 (six) hours. 12/15/20   Dartha Lodge, PA-C  ibuprofen (ADVIL) 600 MG tablet Take 1 tablet (600 mg total) by mouth every 6 (six) hours as needed. Patient not taking: Reported on 11/15/2020 10/20/20   Alric Seton, MD    Allergies    Patient has no known allergies.  Review of Systems   Review of Systems  Constitutional:  Positive for chills and fever.  HENT:  Positive for sore throat and trouble swallowing. Negative for ear pain and facial swelling.   Eyes:  Negative for pain and visual disturbance.  Respiratory:  Negative for cough and shortness of breath.   Cardiovascular:  Negative for chest pain and palpitations.   Gastrointestinal:  Negative for abdominal pain and vomiting.  Genitourinary:  Negative for dysuria and hematuria.  Musculoskeletal:  Negative for arthralgias and back pain.  Skin:  Negative for color change and rash.  Neurological:  Positive for headaches. Negative for seizures and syncope.  All other systems reviewed and are negative.  Physical Exam Updated Vital Signs BP 108/73   Pulse 96   Temp 98.2 F (36.8 C) (Oral)   Resp 16   SpO2 100%   Physical Exam Vitals and nursing note reviewed.  Constitutional:      General: She is not in acute distress.    Appearance: She is well-developed.  HENT:     Head: Normocephalic and atraumatic.     Nose: No congestion or rhinorrhea.     Mouth/Throat:     Pharynx: Oropharyngeal exudate and posterior oropharyngeal erythema present.     Tonsils: Tonsillar exudate present.     Comments: Bilateral oropharyngeal erythema with tonsillar exudate present.  No obvious peritonsillar abscess Eyes:     Conjunctiva/sclera: Conjunctivae normal.  Cardiovascular:     Rate and Rhythm: Normal rate and regular rhythm.     Heart sounds: No murmur heard. Pulmonary:     Effort: Pulmonary effort is normal. No respiratory distress.     Breath sounds: Normal breath sounds.  Abdominal:     Palpations: Abdomen is soft.     Tenderness: There is no abdominal tenderness.  Musculoskeletal:     Cervical back: Neck supple.  Skin:    General: Skin is warm and dry.  Neurological:     Mental Status: She is alert.    ED Results / Procedures / Treatments   Labs (all labs ordered are listed, but only abnormal results are displayed) Labs Reviewed  CBC WITH DIFFERENTIAL/PLATELET - Abnormal; Notable for the following components:      Result Value   Hemoglobin 11.9 (*)    MCH 25.4 (*)    Neutro Abs 8.0 (*)    All other components within normal limits  COMPREHENSIVE METABOLIC PANEL - Abnormal; Notable for the following components:   Sodium 133 (*)    CO2 21  (*)    Calcium 8.7 (*)    Total Protein 8.3 (*)    All other components within normal limits  I-STAT BETA HCG BLOOD, ED ([facility], [facility], [facility] ONLY) - Abnormal; Notable for the following components:   I-stat hCG, quantitative 21.0 (*)    All other components within normal limits  GROUP A STREP BY PCR  RESP PANEL BY RT-PCR (FLU A&B, COVID) ARPGX2  MONONUCLEOSIS SCREEN  HCG, SERUM, QUALITATIVE    EKG None  Radiology CT Soft Tissue Neck W Contrast  Result Date: 01/15/2021 CLINICAL DATA:  Epiglottitis or tonsillitis suspected; tonsillitis, evaluate for PTA; technologist note states sore throat and chills EXAM: CT NECK WITH CONTRAST TECHNIQUE: Multidetector CT imaging of the neck was performed using the standard protocol following the bolus administration of intravenous contrast. CONTRAST:  23mL OMNIPAQUE IOHEXOL 350 MG/ML SOLN COMPARISON:  None. FINDINGS: Pharynx and larynx: Prominence of the palatine and lingual tonsils. No evidence of peritonsillar abscess. No significant parapharyngeal inflammatory changes. Airway is patent. Salivary glands: Parotid and submandibular glands are unremarkable. Thyroid: Normal. Lymph nodes: Mildly enlarged level 2 nodes are likely reactive. For example, on the left measuring 1.6 cm. Vascular: Major neck vessels are patent. Limited intracranial: No abnormal enhancement. Visualized orbits: Unremarkable. Mastoids and visualized paranasal sinuses: Aerated. Skeleton: Cough Upper chest: Included upper lungs are clear. Other: None. IMPRESSION: Prominence of the palatine and lingual tonsils may reflect tonsillitis. No evidence of peritonsillar abscess or significant parapharyngeal inflammatory changes. Mild likely reactive adenopathy. Electronically Signed   By: Guadlupe Spanish M.D.   On: 01/15/2021 15:22    Procedures Procedures   Medications Ordered in ED Medications  acetaminophen (TYLENOL) tablet 650 mg (650 mg Oral Given 01/15/21 1207)  ibuprofen (ADVIL) tablet 800 mg (800 mg  Oral Given 01/15/21 1401)  lactated ringers bolus 1,000 mL (0 mLs Intravenous Stopped 01/15/21 1541)  iohexol (OMNIPAQUE) 350 MG/ML injection 75 mL (60 mLs Intravenous Contrast Given 01/15/21 1504)    ED Course  I have reviewed the triage vital signs and the nursing notes.  Pertinent labs & imaging results that were available during my care of the patient were reviewed by me and considered in my medical decision making (see chart for details).    MDM Rules/Calculators/A&P                           25 year old female presenting with sore throat as per above.  Febrile to 102.3, tachycardic to the 120s on arrival.  Administered IV fluids, Tylenol and Motrin.  SARS PCR testing and influenza testing negative.  Strep PCR negative.  Mononucleosis screen negative.  The patient endorses a sore throat with a unilateral component of swelling on the right.  No obvious peritonsillar abscess on exam and good range of motion of the neck, however the unilateral aspect of swelling with increasing difficulty swallowing raises concern for a deeper space infection.  Will obtain CT of the neck to further evaluate.  Labs without a clear leukocytosis, WBC 10.  CT of the neck with prominence of the palatine and lingual tonsils which may reflect tonsillitis.  No evidence of peritonsillar abscess or significant parapharyngeal inflammatory changes.  Mild likely reactive adenopathy.  We will discharge the patient with a course of Augmentin.  Advised the patient to follow-up if symptoms worsen.   Final Clinical Impression(s) / ED Diagnoses Final diagnoses:  Acute pharyngitis, unspecified etiology    Rx / DC Orders ED Discharge Orders          Ordered    amoxicillin-clavulanate (AUGMENTIN) 875-125 MG tablet  Every 12 hours        01/15/21 1540             Ernie Avena, MD 01/16/21 1135

## 2021-01-15 NOTE — ED Notes (Signed)
Patient transported to CT 

## 2021-01-15 NOTE — ED Triage Notes (Signed)
Pt reports sore throat, chills, and headache that began 2 days ago.

## 2021-01-15 NOTE — Discharge Instructions (Addendum)
We will treat with a course of antibiotics for tonsillar cellulitis. Return for significant difficulty swallowing, worsening asymmetric throat swelling and pain, difficulty moving your neck. Recommend Tylenol for pain and fever, continued oral rehydration, and throat lozenges for discomfort.

## 2021-01-15 NOTE — ED Notes (Signed)
Patient returned from CT

## 2023-03-20 IMAGING — CT CT NECK W/ CM
3 of 4 series · 11 of 33 positions shown, 13 images · IV contrast (omnipaque)
Comparison: None.

CLINICAL DATA: Epiglottitis or tonsillitis suspected; tonsillitis,
evaluate for PTA; technologist note states sore throat and chills

EXAM:
CT NECK WITH CONTRAST
TECHNIQUE: Multidetector CT imaging of the neck was performed using the
standard protocol following the bolus administration of intravenous
contrast.
CONTRAST:  60mL OMNIPAQUE IOHEXOL 350 MG/ML SOLN

[Series 5: axial · axial · 0.31mm/px · z∈[-317,-161]mm · 3 of 150 slices shown, 4 images]
[im 43/150  soft-tissue]
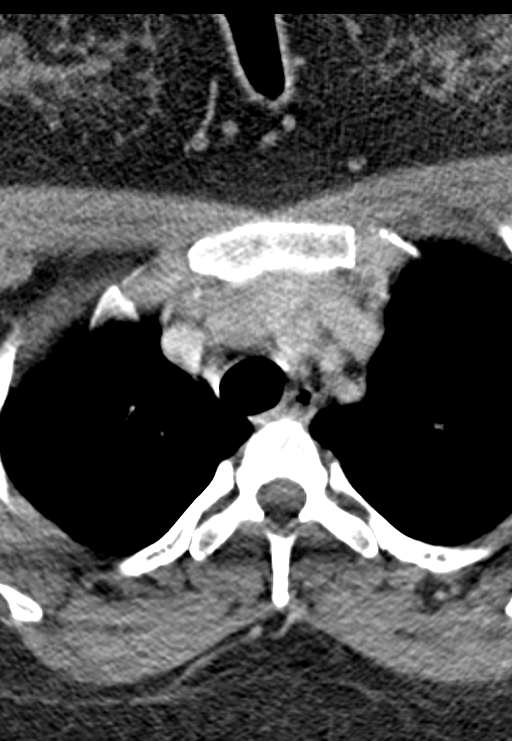
[im 43/150  bone]
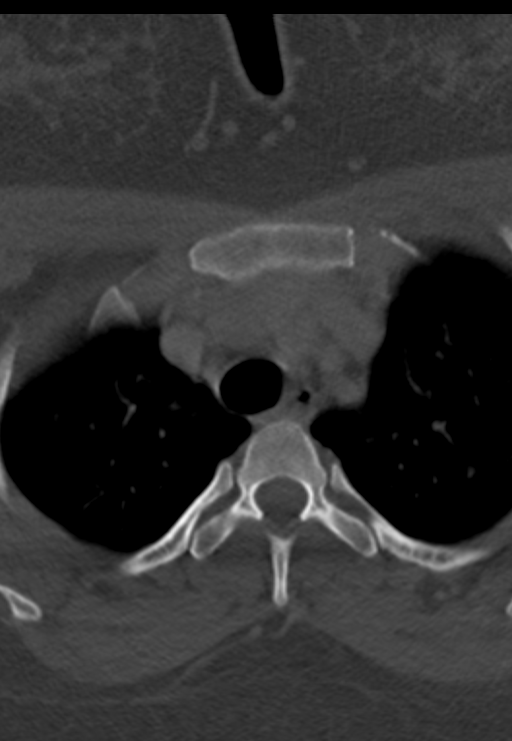
[im 86/150  bone]
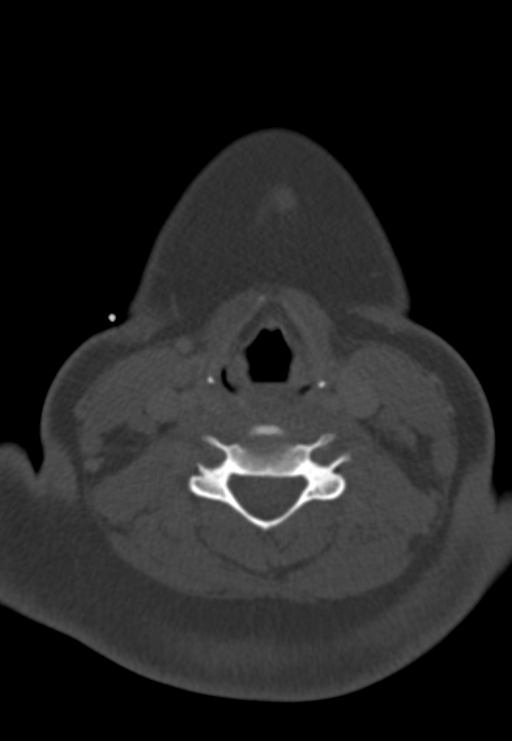
[im 128/150  bone]
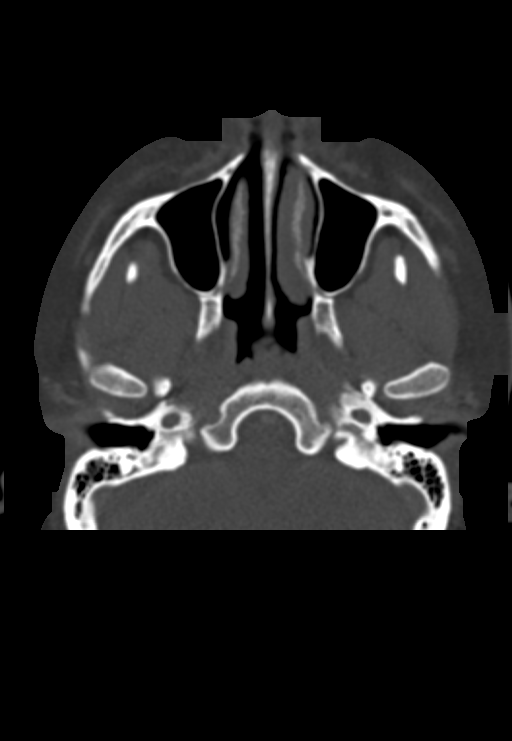

[Series 6: coronal · coronal · 0.31mm/px · 3 of 117 slices shown]
[im 40/117  bone]
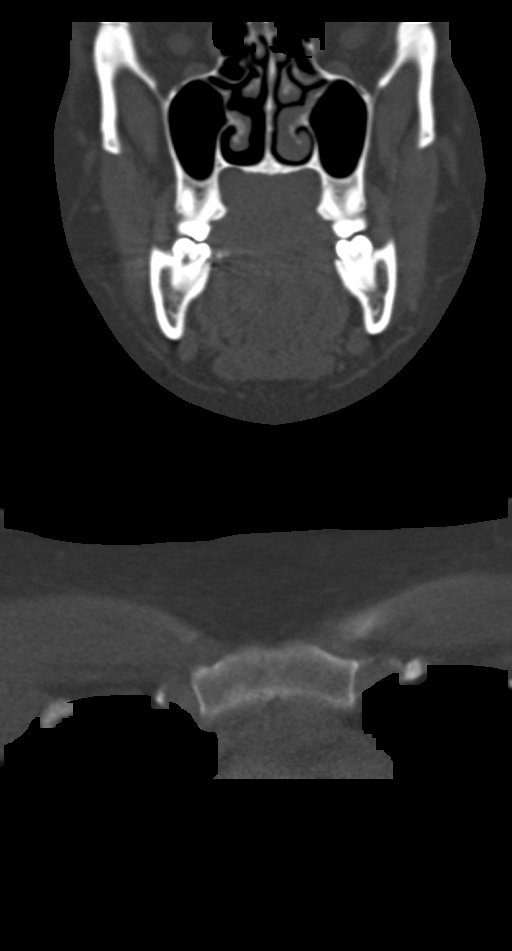
[im 52/117  bone]
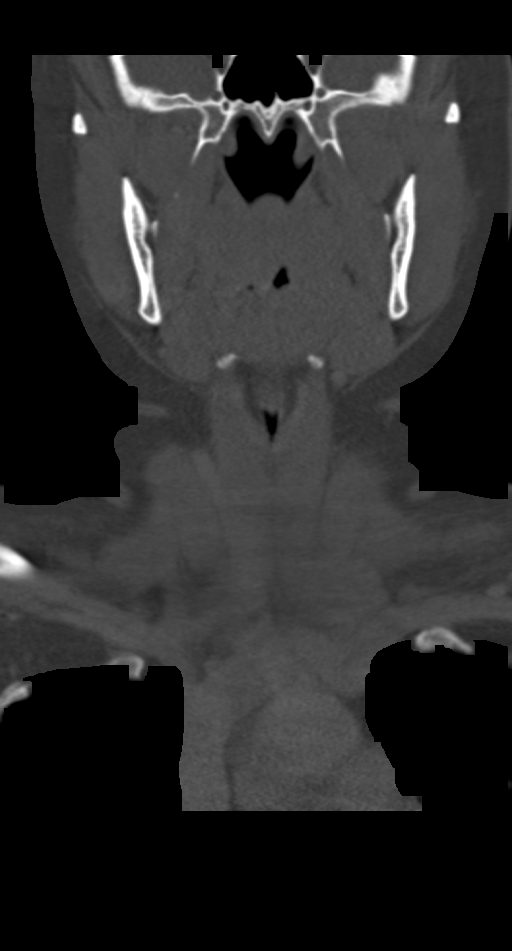
[im 65/117  bone]
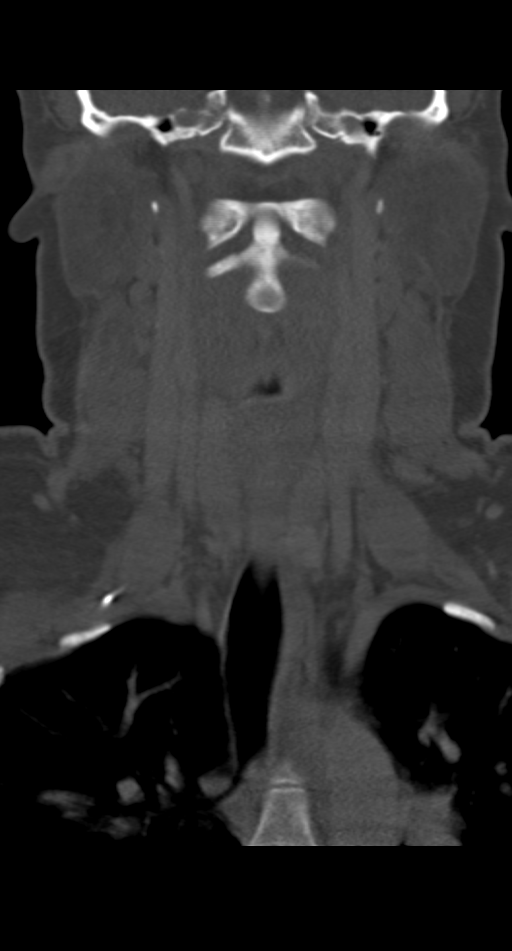

[Series 7: sagittal · sagittal · 0.46mm/px · 5 of 81 slices shown, 6 images]
[im 27/81  bone]
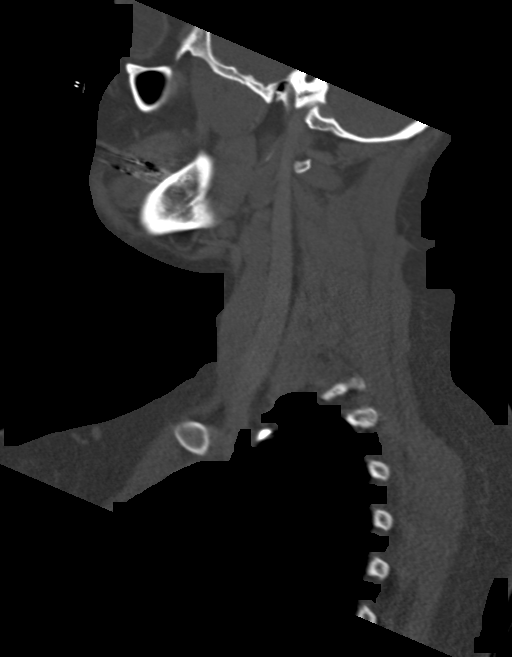
[im 34/81  bone]
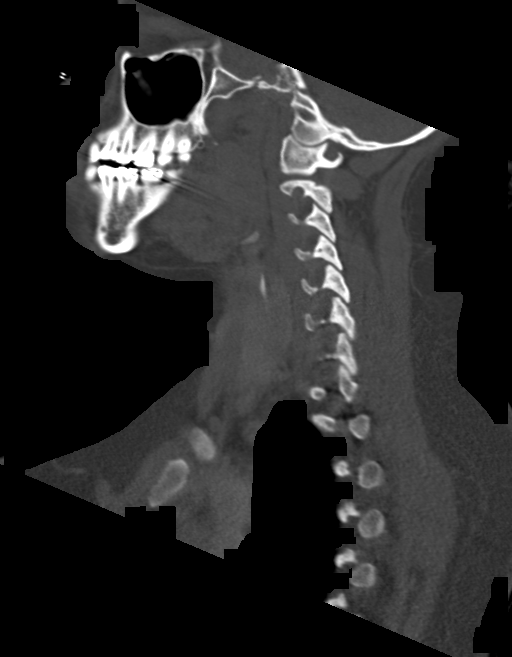
[im 41/81  soft-tissue]
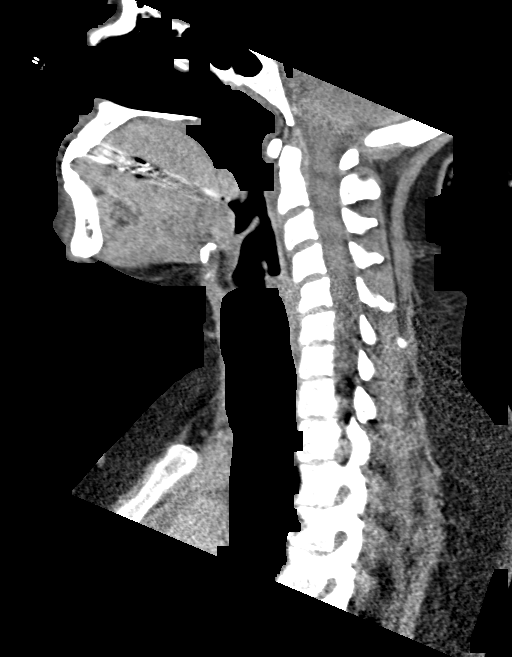
[im 41/81  bone]
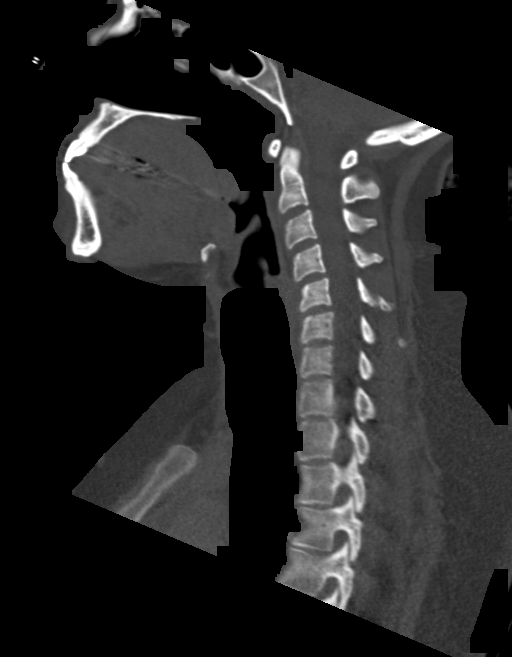
[im 47/81  bone]
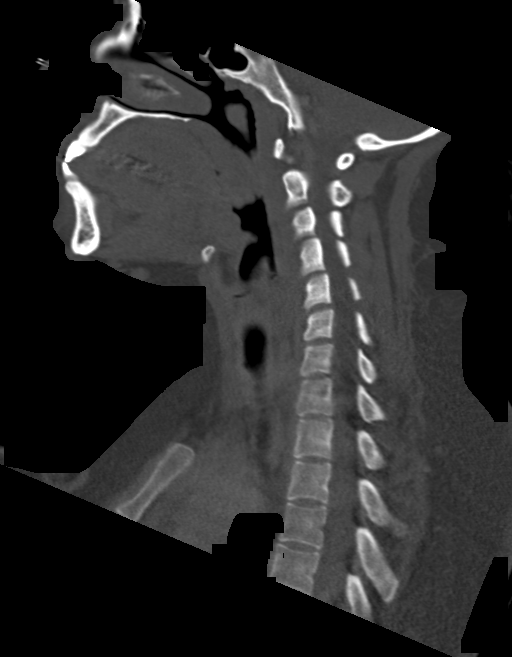
[im 54/81  bone]
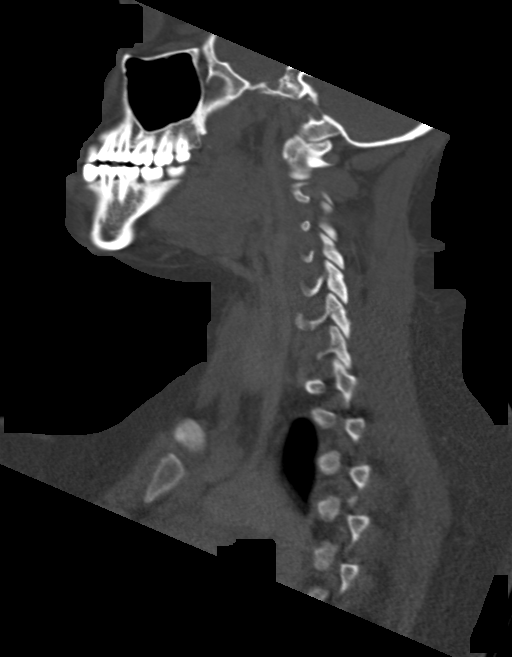

[11 of 33 positions shown; findings below may reference images not displayed]

FINDINGS: Pharynx and larynx: Prominence of the palatine and lingual tonsils.
No evidence of peritonsillar abscess. No significant parapharyngeal
inflammatory changes. Airway is patent.

Salivary glands: Parotid and submandibular glands are unremarkable.

Thyroid: Normal.

Lymph nodes: Mildly enlarged level 2 nodes are likely reactive. For
example, on the left measuring 1.6 cm.

Vascular: Major neck vessels are patent.

Limited intracranial: No abnormal enhancement.

Visualized orbits: Unremarkable.

Mastoids and visualized paranasal sinuses: Aerated.

Skeleton: Cough

Upper chest: Included upper lungs are clear.

Other: None.
IMPRESSION: Prominence of the palatine and lingual tonsils may reflect
tonsillitis. No evidence of peritonsillar abscess or significant
parapharyngeal inflammatory changes. Mild likely reactive
adenopathy.
